# Patient Record
Sex: Female | Born: 1960
Health system: Southern US, Community
[De-identification: ages and names within clinical notes are randomized; demographics above are authoritative.]

## PROBLEM LIST (undated history)

## (undated) DIAGNOSIS — E119 Type 2 diabetes mellitus without complications: Secondary | ICD-10-CM

## (undated) DIAGNOSIS — R87619 Unspecified abnormal cytological findings in specimens from cervix uteri: Secondary | ICD-10-CM

## (undated) DIAGNOSIS — E785 Hyperlipidemia, unspecified: Secondary | ICD-10-CM

## (undated) DIAGNOSIS — A64 Unspecified sexually transmitted disease: Secondary | ICD-10-CM

## (undated) HISTORY — DX: Unspecified sexually transmitted disease: A64

## (undated) HISTORY — DX: Hyperlipidemia, unspecified: E78.5

## (undated) HISTORY — PX: CRYOTHERAPY: SHX1416

## (undated) HISTORY — PX: LAPAROSCOPY FOR ECTOPIC PREGNANCY: SUR765

## (undated) HISTORY — DX: Unspecified abnormal cytological findings in specimens from cervix uteri: R87.619

## (undated) HISTORY — DX: Type 2 diabetes mellitus without complications: E11.9

---

## 1997-04-11 HISTORY — PX: TUBAL LIGATION: SHX77

## 1997-04-11 HISTORY — PX: INGUINAL HERNIA REPAIR: SUR1180

## 1998-09-16 ENCOUNTER — Other Ambulatory Visit: Admission: RE | Admit: 1998-09-16 | Discharge: 1998-09-16 | Payer: Self-pay | Admitting: Gynecology

## 1999-09-16 ENCOUNTER — Other Ambulatory Visit: Admission: RE | Admit: 1999-09-16 | Discharge: 1999-09-16 | Payer: Self-pay | Admitting: Gynecology

## 1999-09-16 ENCOUNTER — Encounter: Payer: Self-pay | Admitting: Gynecology

## 1999-09-16 ENCOUNTER — Encounter (INDEPENDENT_AMBULATORY_CARE_PROVIDER_SITE_OTHER): Payer: Self-pay | Admitting: Specialist

## 1999-09-16 ENCOUNTER — Encounter: Admission: RE | Admit: 1999-09-16 | Discharge: 1999-09-16 | Payer: Self-pay | Admitting: Gynecology

## 1999-09-16 HISTORY — PX: BREAST BIOPSY: SHX20

## 2000-04-05 ENCOUNTER — Encounter: Admission: RE | Admit: 2000-04-05 | Discharge: 2000-04-05 | Payer: Self-pay | Admitting: Gynecology

## 2000-04-05 ENCOUNTER — Encounter: Payer: Self-pay | Admitting: Gynecology

## 2000-10-02 ENCOUNTER — Other Ambulatory Visit: Admission: RE | Admit: 2000-10-02 | Discharge: 2000-10-02 | Payer: Self-pay | Admitting: Gynecology

## 2000-10-31 ENCOUNTER — Encounter: Admission: RE | Admit: 2000-10-31 | Discharge: 2001-01-29 | Payer: Self-pay | Admitting: Internal Medicine

## 2001-04-18 ENCOUNTER — Encounter: Payer: Self-pay | Admitting: Gynecology

## 2001-04-18 ENCOUNTER — Ambulatory Visit (HOSPITAL_COMMUNITY): Admission: RE | Admit: 2001-04-18 | Discharge: 2001-04-18 | Payer: Self-pay | Admitting: Gynecology

## 2001-10-15 ENCOUNTER — Other Ambulatory Visit: Admission: RE | Admit: 2001-10-15 | Discharge: 2001-10-15 | Payer: Self-pay | Admitting: Obstetrics and Gynecology

## 2002-04-25 ENCOUNTER — Ambulatory Visit (HOSPITAL_COMMUNITY): Admission: RE | Admit: 2002-04-25 | Discharge: 2002-04-25 | Payer: Self-pay | Admitting: Obstetrics and Gynecology

## 2002-04-25 ENCOUNTER — Encounter: Payer: Self-pay | Admitting: Obstetrics and Gynecology

## 2003-02-24 ENCOUNTER — Other Ambulatory Visit: Admission: RE | Admit: 2003-02-24 | Discharge: 2003-02-24 | Payer: Self-pay | Admitting: Obstetrics and Gynecology

## 2003-04-30 ENCOUNTER — Ambulatory Visit (HOSPITAL_COMMUNITY): Admission: RE | Admit: 2003-04-30 | Discharge: 2003-04-30 | Payer: Self-pay | Admitting: Obstetrics and Gynecology

## 2003-05-01 ENCOUNTER — Encounter: Admission: RE | Admit: 2003-05-01 | Discharge: 2003-05-01 | Payer: Self-pay | Admitting: Obstetrics and Gynecology

## 2003-06-09 ENCOUNTER — Encounter: Admission: RE | Admit: 2003-06-09 | Discharge: 2003-06-09 | Payer: Self-pay | Admitting: Obstetrics and Gynecology

## 2004-04-07 ENCOUNTER — Other Ambulatory Visit: Admission: RE | Admit: 2004-04-07 | Discharge: 2004-04-07 | Payer: Self-pay | Admitting: Obstetrics and Gynecology

## 2004-05-07 ENCOUNTER — Ambulatory Visit (HOSPITAL_COMMUNITY): Admission: RE | Admit: 2004-05-07 | Discharge: 2004-05-07 | Payer: Self-pay | Admitting: Obstetrics and Gynecology

## 2004-08-05 ENCOUNTER — Encounter: Admission: RE | Admit: 2004-08-05 | Discharge: 2004-08-05 | Payer: Self-pay | Admitting: Orthopedic Surgery

## 2005-05-23 ENCOUNTER — Ambulatory Visit (HOSPITAL_COMMUNITY): Admission: RE | Admit: 2005-05-23 | Discharge: 2005-05-23 | Payer: Self-pay | Admitting: Obstetrics and Gynecology

## 2005-06-15 ENCOUNTER — Other Ambulatory Visit: Admission: RE | Admit: 2005-06-15 | Discharge: 2005-06-15 | Payer: Self-pay | Admitting: Obstetrics and Gynecology

## 2006-05-24 ENCOUNTER — Ambulatory Visit (HOSPITAL_COMMUNITY): Admission: RE | Admit: 2006-05-24 | Discharge: 2006-05-24 | Payer: Self-pay | Admitting: Obstetrics and Gynecology

## 2006-09-18 ENCOUNTER — Other Ambulatory Visit: Admission: RE | Admit: 2006-09-18 | Discharge: 2006-09-18 | Payer: Self-pay | Admitting: Obstetrics and Gynecology

## 2007-05-28 ENCOUNTER — Ambulatory Visit (HOSPITAL_COMMUNITY): Admission: RE | Admit: 2007-05-28 | Discharge: 2007-05-28 | Payer: Self-pay | Admitting: Internal Medicine

## 2007-10-01 ENCOUNTER — Other Ambulatory Visit: Admission: RE | Admit: 2007-10-01 | Discharge: 2007-10-01 | Payer: Self-pay | Admitting: Obstetrics and Gynecology

## 2008-06-03 ENCOUNTER — Ambulatory Visit (HOSPITAL_COMMUNITY): Admission: RE | Admit: 2008-06-03 | Discharge: 2008-06-03 | Payer: Self-pay | Admitting: Obstetrics and Gynecology

## 2009-03-18 DIAGNOSIS — E119 Type 2 diabetes mellitus without complications: Secondary | ICD-10-CM | POA: Insufficient documentation

## 2009-03-18 DIAGNOSIS — E785 Hyperlipidemia, unspecified: Secondary | ICD-10-CM | POA: Insufficient documentation

## 2009-03-18 DIAGNOSIS — B009 Herpesviral infection, unspecified: Secondary | ICD-10-CM | POA: Insufficient documentation

## 2009-06-17 ENCOUNTER — Ambulatory Visit (HOSPITAL_COMMUNITY): Admission: RE | Admit: 2009-06-17 | Discharge: 2009-06-17 | Payer: Self-pay | Admitting: Obstetrics and Gynecology

## 2010-05-27 ENCOUNTER — Other Ambulatory Visit (HOSPITAL_COMMUNITY): Payer: Self-pay | Admitting: Internal Medicine

## 2010-05-27 DIAGNOSIS — Z1231 Encounter for screening mammogram for malignant neoplasm of breast: Secondary | ICD-10-CM

## 2010-06-21 ENCOUNTER — Ambulatory Visit (HOSPITAL_COMMUNITY)
Admission: RE | Admit: 2010-06-21 | Discharge: 2010-06-21 | Disposition: A | Discharge status: Home / Self Care | Payer: BC Managed Care – PPO | Source: Ambulatory Visit | Attending: Internal Medicine | Admitting: Internal Medicine

## 2010-06-21 DIAGNOSIS — Z1231 Encounter for screening mammogram for malignant neoplasm of breast: Secondary | ICD-10-CM | POA: Insufficient documentation

## 2011-06-06 ENCOUNTER — Other Ambulatory Visit: Payer: Self-pay | Admitting: Certified Nurse Midwife

## 2011-06-06 DIAGNOSIS — Z1231 Encounter for screening mammogram for malignant neoplasm of breast: Secondary | ICD-10-CM

## 2011-06-28 ENCOUNTER — Ambulatory Visit (HOSPITAL_COMMUNITY)
Admission: RE | Admit: 2011-06-28 | Discharge: 2011-06-28 | Disposition: A | Discharge status: Home / Self Care | Payer: BC Managed Care – PPO | Source: Ambulatory Visit | Attending: Certified Nurse Midwife | Admitting: Certified Nurse Midwife

## 2011-06-28 DIAGNOSIS — Z1231 Encounter for screening mammogram for malignant neoplasm of breast: Secondary | ICD-10-CM | POA: Insufficient documentation

## 2012-04-11 HISTORY — PX: COLONOSCOPY: SHX174

## 2012-04-16 ENCOUNTER — Encounter: Payer: Self-pay | Admitting: Internal Medicine

## 2012-05-18 ENCOUNTER — Ambulatory Visit (AMBULATORY_SURGERY_CENTER): Payer: BC Managed Care – PPO | Admitting: *Deleted

## 2012-05-18 ENCOUNTER — Encounter: Payer: Self-pay | Admitting: Internal Medicine

## 2012-05-18 VITALS — Ht 66.0 in | Wt 139.0 lb

## 2012-05-18 DIAGNOSIS — Z1211 Encounter for screening for malignant neoplasm of colon: Secondary | ICD-10-CM

## 2012-05-18 MED ORDER — MOVIPREP 100 G PO SOLR: ORAL | Status: DC

## 2012-05-25 ENCOUNTER — Other Ambulatory Visit (HOSPITAL_COMMUNITY): Payer: Self-pay | Admitting: Certified Nurse Midwife

## 2012-05-25 DIAGNOSIS — Z1231 Encounter for screening mammogram for malignant neoplasm of breast: Secondary | ICD-10-CM

## 2012-05-29 ENCOUNTER — Encounter (HOSPITAL_COMMUNITY): Payer: Self-pay | Admitting: Emergency Medicine

## 2012-05-29 ENCOUNTER — Emergency Department (HOSPITAL_COMMUNITY)
Admission: EM | Admit: 2012-05-29 | Discharge: 2012-05-29 | Disposition: A | Discharge status: Home / Self Care | Payer: BC Managed Care – PPO | Attending: Emergency Medicine | Admitting: Emergency Medicine

## 2012-05-29 DIAGNOSIS — R110 Nausea: Secondary | ICD-10-CM | POA: Insufficient documentation

## 2012-05-29 DIAGNOSIS — Z794 Long term (current) use of insulin: Secondary | ICD-10-CM | POA: Insufficient documentation

## 2012-05-29 DIAGNOSIS — Z79899 Other long term (current) drug therapy: Secondary | ICD-10-CM | POA: Insufficient documentation

## 2012-05-29 DIAGNOSIS — R4182 Altered mental status, unspecified: Secondary | ICD-10-CM | POA: Insufficient documentation

## 2012-05-29 DIAGNOSIS — E1069 Type 1 diabetes mellitus with other specified complication: Secondary | ICD-10-CM | POA: Insufficient documentation

## 2012-05-29 DIAGNOSIS — E162 Hypoglycemia, unspecified: Secondary | ICD-10-CM

## 2012-05-29 DIAGNOSIS — F29 Unspecified psychosis not due to a substance or known physiological condition: Secondary | ICD-10-CM | POA: Insufficient documentation

## 2012-05-29 DIAGNOSIS — R61 Generalized hyperhidrosis: Secondary | ICD-10-CM | POA: Insufficient documentation

## 2012-05-29 DIAGNOSIS — R5381 Other malaise: Secondary | ICD-10-CM | POA: Insufficient documentation

## 2012-05-29 DIAGNOSIS — E785 Hyperlipidemia, unspecified: Secondary | ICD-10-CM | POA: Insufficient documentation

## 2012-05-29 LAB — GLUCOSE, CAPILLARY
Glucose-Capillary: 23 mg/dL — CL (ref 70–99)
Glucose-Capillary: 125 mg/dL — ABNORMAL HIGH (ref 70–99)

## 2012-05-29 MED ORDER — GLUCOSE-VITAMIN C 4-6 GM-MG PO CHEW
CHEWABLE_TABLET | ORAL | Status: AC
  Filled 2012-05-29: qty 1

## 2012-05-29 MED ORDER — DEXTROSE 50 % IV SOLN
INTRAVENOUS | Status: AC
  Administered 2012-05-29: 50 mL
  Filled 2012-05-29: qty 50

## 2012-05-29 NOTE — ED Notes (Signed)
Pt started to become confused and diaphoretic at home. Pt has been doing colonoscopy prep at home and restricted diet. Pt presented to ER disoriented x4, unable to follow commands. CBG- 23 on arrival. D50 amp given IV. Pt now oriented to person and place.

## 2012-05-29 NOTE — ED Provider Notes (Signed)
History     CSN: [id number]  Arrival date & time 05/29/12  2204   None     Chief Complaint  Patient presents with  . Hypoglycemia    (Consider location/radiation/quality/duration/timing/severity/associated sxs/prior treatment) Patient is a 52 y.o. female presenting with altered mental status.  Altered Mental Status This is a new problem. The current episode started today. The problem occurs constantly. The problem has been rapidly improving. Associated symptoms include diaphoresis, fatigue and nausea. Pertinent negatives include no abdominal pain, arthralgias, chest pain, chills, congestion, coughing, fever, headaches, neck pain, numbness, sore throat, vomiting or weakness. Associated symptoms comments: Diaphoresis. Nothing aggravates the symptoms. She has tried nothing for the symptoms. The treatment provided no relief.    Past Medical History  Diagnosis Date  . Diabetes mellitus without complication     type 1  . Hyperlipidemia     Past Surgical History  Procedure Laterality Date  . Tubal ligation  1999  . Inguinal hernia repair  1999    left  . Laparoscopy for ectopic pregnancy  1993 & 1992    No family history on file.  History  Substance Use Topics  . Smoking status: Never Smoker   . Smokeless tobacco: Never Used  . Alcohol Use: 1.8 oz/week    3 Glasses of wine per week    OB History   Grav Para Term Preterm Abortions TAB SAB Ect Mult Living                  Review of Systems  Constitutional: Positive for diaphoresis and fatigue. Negative for fever, chills, activity change and appetite change.  HENT: Negative for congestion, sore throat, facial swelling, rhinorrhea, neck pain and neck stiffness.   Eyes: Negative for photophobia and discharge.  Respiratory: Negative for cough, chest tightness and shortness of breath.   Cardiovascular: Negative for chest pain, palpitations and leg swelling.  Gastrointestinal: Positive for nausea. Negative for vomiting,  abdominal pain and diarrhea.  Endocrine: Negative for polydipsia and polyuria.  Genitourinary: Negative for dysuria, frequency, difficulty urinating and pelvic pain.  Musculoskeletal: Negative for back pain and arthralgias.  Skin: Negative for color change and wound.  Allergic/Immunologic: Negative for immunocompromised state.  Neurological: Negative for facial asymmetry, weakness, numbness and headaches.  Hematological: Does not bruise/bleed easily.  Psychiatric/Behavioral: Positive for confusion and altered mental status. Negative for agitation.    Allergies  Penicillins  Home Medications   Current Outpatient Rx  Name  Route  Sig  Dispense  Refill  . insulin glargine (LANTUS) 100 UNIT/ML injection   Subcutaneous   Inject 16-18 Units into the skin daily with breakfast. Based on CBG         . insulin lispro (HUMALOG) 100 UNIT/ML injection   Subcutaneous   Inject 4-12 Units into the skin 2 (two) times daily. Sliding scale based on CBG         . metFORMIN (GLUMETZA) 500 MG (MOD) 24 hr tablet   Oral   Take 1,000 mg by mouth at bedtime.         . simvastatin (ZOCOR) 40 MG tablet   Oral   Take 40 mg by mouth every evening.         Marland Kitchen MOVIPREP 100 G SOLR      MOVI PREP take as directed no substitution   1 kit   0     Dispense as written.     BP 119/77  Pulse 63  Temp(Src) 0 F (-17.8 C)  Resp  18  SpO2 100%  Physical Exam  Constitutional: She is oriented to person, place, and time. She appears well-developed and well-nourished. No distress.  HENT:  Head: Normocephalic and atraumatic.  Mouth/Throat: No oropharyngeal exudate.  Eyes: Pupils are equal, round, and reactive to light.  Neck: Normal range of motion. Neck supple.  Cardiovascular: Normal rate, regular rhythm and normal heart sounds.  Exam reveals no gallop and no friction rub.   No murmur heard. Pulmonary/Chest: Effort normal and breath sounds normal. No respiratory distress. She has no wheezes. She  has no rales.  Abdominal: Soft. Bowel sounds are normal. She exhibits no distension and no mass. There is no tenderness. There is no rebound and no guarding.  Musculoskeletal: Normal range of motion. She exhibits no edema and no tenderness.  Neurological: She is alert and oriented to person, place, and time. She has normal strength. She displays no tremor. No cranial nerve deficit or sensory deficit. She exhibits normal muscle tone. Coordination normal. GCS eye subscore is 4. GCS verbal subscore is 5. GCS motor subscore is 6.  Skin: Skin is warm. She is diaphoretic.  Psychiatric: She has a normal mood and affect.    ED Course  Procedures (including critical care time)  Labs Reviewed  GLUCOSE, CAPILLARY - Abnormal; Notable for the following:    Glucose-Capillary 23 (*)    All other components within normal limits  GLUCOSE, CAPILLARY - Abnormal; Notable for the following:    Glucose-Capillary 125 (*)    All other components within normal limits   No results found.   1. Hypoglycemia       MDM  Pt is a 52 y.o. female with pertinent PMHX of DM, HLD who presents with confusion, diaphoresis, and FSBG 23.  1 amp D50 given upon arrival with improvement of symptoms.  At time of my exam, pt A&Ox3, no focal neuro findings. Will give [place] sandwich and reexamine. Doubt TIA/CVA, cardiac event.  11:27 PM Pt feeling improved. Will d/c home w/ instructions for more frequest FSBG check at home, cautious use of large insulin boluses and PCP f/u.  Return precautions given for new or worsening symptoms.  1. Hypoglycemia      Labs and imaging considered in decision making, reviewed by myself.  Imaging interpreted by radiology. Pt care discussed with my attending, Dr. Jeraldine Loots.         Toy Cookey, MD 05/30/12 [phone]

## 2012-05-29 NOTE — ED Notes (Signed)
CBG 23 

## 2012-05-31 NOTE — ED Provider Notes (Signed)
  I performed a history and physical examination of Sandra Hooper and discussed her management with Dr. Micheline Maze.  I agree with the history, physical, assessment, and plan of care, with the following exceptions: None  On my exam the patient was recovering well, mentating appropriately, with unremarkable vital signs.    Elyse Jarvis, MD 05/31/12 [phone]

## 2012-06-01 ENCOUNTER — Ambulatory Visit (AMBULATORY_SURGERY_CENTER): Payer: BC Managed Care – PPO | Admitting: Internal Medicine

## 2012-06-01 ENCOUNTER — Encounter: Payer: Self-pay | Admitting: Internal Medicine

## 2012-06-01 ENCOUNTER — Other Ambulatory Visit: Payer: Self-pay | Admitting: Internal Medicine

## 2012-06-01 VITALS — BP 121/81 | HR 55 | Temp 98.4°F | Resp 17 | Ht 66.0 in | Wt 139.0 lb

## 2012-06-01 DIAGNOSIS — Z1211 Encounter for screening for malignant neoplasm of colon: Secondary | ICD-10-CM

## 2012-06-01 LAB — GLUCOSE, CAPILLARY
Glucose-Capillary: 219 mg/dL — ABNORMAL HIGH (ref 70–99)
Glucose-Capillary: 67 mg/dL — ABNORMAL LOW (ref 70–99)
Glucose-Capillary: 148 mg/dL — ABNORMAL HIGH (ref 70–99)

## 2012-06-01 MED ORDER — SODIUM CHLORIDE 0.9 % IV SOLN: 500.0000 mL | INTRAVENOUS | Status: DC

## 2012-06-01 NOTE — Progress Notes (Signed)
In recovery room report to pacu rn, vss, bbs=clear

## 2012-06-01 NOTE — Op Note (Signed)
[facility] [address] [place], [address]   COLONOSCOPY PROCEDURE REPORT  PATIENT: Sandra Hooper, Sandra Hooper  MR#: [id number] BIRTHDATE: 1960-07-25 , 52  yrs. old GENDER: Female ENDOSCOPIST: Hart Carwin, MD REFERRED BY:  Francis Gaines, M.D. PROCEDURE DATE:  06/01/2012 PROCEDURE:   Colonoscopy, screening ASA CLASS:   Class I INDICATIONS:Average risk patient for colon cancer. MEDICATIONS: MAC sedation, administered by CRNA and Propofol (Diprivan) 280 mg IV  DESCRIPTION OF PROCEDURE:   After the risks and benefits and of the procedure were explained, informed consent was obtained.  A digital rectal exam revealed no abnormalities of the rectum.    The LB [id number]-[id number]  endoscope was introduced through the anus and advanced to the cecum, which was identified by both the appendix and ileocecal valve .  The quality of the prep was good, using MoviPrep .  The instrument was then slowly withdrawn as the colon was fully examined.     COLON FINDINGS: Mild diverticulosis was noted in the sigmoid colon. Internal hemorrhoids were found.     Retroflexed views revealed no abnormalities.     The scope was then withdrawn from the patient and the procedure completed.  COMPLICATIONS: There were no complications. ENDOSCOPIC IMPRESSION: 1.   Mild diverticulosis was noted in the sigmoid colon 2.   Internal hemorrhoids  RECOMMENDATIONS: High fiber diet   REPEAT EXAM: In 10 year(s)  for Colonoscopy.  cc:  _______________________________ eSignedHart Carwin, MD 06/01/2012 9:08 AM

## 2012-06-01 NOTE — Patient Instructions (Addendum)
YOU HAD AN ENDOSCOPIC PROCEDURE TODAY AT THE [facility]: Refer to the procedure report that was given to you for any specific questions about what was found during the examination.  If the procedure report does not answer your questions, please call your gastroenterologist to clarify.  If you requested that your care partner not be given the details of your procedure findings, then the procedure report has been included in a sealed envelope for you to review at your convenience later.  YOU SHOULD EXPECT: Some feelings of bloating in the abdomen. Passage of more gas than usual.  Walking can help get rid of the air that was put into your GI tract during the procedure and reduce the bloating. If you had a lower endoscopy (such as a colonoscopy or flexible sigmoidoscopy) you may notice spotting of blood in your stool or on the toilet paper. If you underwent a bowel prep for your procedure, then you may not have a normal bowel movement for a few days.  DIET: Your first meal following the procedure should be a light meal and then it is ok to progress to your normal diet.  A half-sandwich or bowl of soup is an example of a good first meal.  Heavy or fried foods are harder to digest and may make you feel nauseous or bloated.  Likewise meals heavy in dairy and vegetables can cause extra gas to form and this can also increase the bloating.  Drink plenty of fluids but you should avoid alcoholic beverages for 24 hours.  ACTIVITY: Your care partner should take you home directly after the procedure.  You should plan to take it easy, moving slowly for the rest of the day.  You can resume normal activity the day after the procedure however you should NOT DRIVE or use heavy machinery for 24 hours (because of the sedation medicines used during the test).    SYMPTOMS TO REPORT IMMEDIATELY: A gastroenterologist can be reached at any hour.  During normal business hours, 8:30 AM to 5:00 PM Monday through Friday,  call [phone].  After hours and on weekends, please call the GI answering service at [phone] who will take a message and have the physician on call contact you.   Following lower endoscopy (colonoscopy or flexible sigmoidoscopy):  Excessive amounts of blood in the stool  Significant tenderness or worsening of abdominal pains  Swelling of the abdomen that is new, acute  Fever of 100F or higher  FOLLOW UP: If any biopsies were taken you will be contacted by phone or by letter within the next 1-3 weeks.  Call your gastroenterologist if you have not heard about the biopsies in 3 weeks.  Our staff will call the home number listed on your records the next business day following your procedure to check on you and address any questions or concerns that you may have at that time regarding the information given to you following your procedure. This is a courtesy call and so if there is no answer at the home number and we have not heard from you through the emergency physician on call, we will assume that you have returned to your regular daily activities without incident.  SIGNATURES/CONFIDENTIALITY: You and/or your care partner have signed paperwork which will be entered into your electronic medical record.  These signatures attest to the fact that that the information above on your After Visit Summary has been reviewed and is understood.  Full responsibility of the confidentiality of this   discharge information lies with you and/or your care-partner.   Thank-you for choosing us for your medical needs. 

## 2012-06-01 NOTE — Progress Notes (Signed)
Patient did not have preoperative order for IV antibiotic SSI prophylaxis. (G8918)  Patient did not experience any of the following events: a burn prior to discharge; a fall within the facility; wrong site/side/patient/procedure/implant event; or a hospital transfer or hospital admission upon discharge from the facility. (G8907)  

## 2012-06-04 ENCOUNTER — Telehealth: Payer: Self-pay

## 2012-06-04 NOTE — Telephone Encounter (Signed)
Left message

## 2012-06-25 DIAGNOSIS — E162 Hypoglycemia, unspecified: Secondary | ICD-10-CM | POA: Insufficient documentation

## 2012-06-28 ENCOUNTER — Ambulatory Visit (HOSPITAL_COMMUNITY): Payer: BC Managed Care – PPO

## 2012-07-02 ENCOUNTER — Ambulatory Visit (HOSPITAL_COMMUNITY)
Admission: RE | Admit: 2012-07-02 | Discharge: 2012-07-02 | Disposition: A | Discharge status: Home / Self Care | Payer: BC Managed Care – PPO | Source: Ambulatory Visit | Attending: Certified Nurse Midwife | Admitting: Certified Nurse Midwife

## 2012-07-02 DIAGNOSIS — Z1231 Encounter for screening mammogram for malignant neoplasm of breast: Secondary | ICD-10-CM | POA: Insufficient documentation

## 2012-12-11 ENCOUNTER — Encounter: Payer: Self-pay | Admitting: Certified Nurse Midwife

## 2012-12-11 ENCOUNTER — Ambulatory Visit (INDEPENDENT_AMBULATORY_CARE_PROVIDER_SITE_OTHER): Payer: BC Managed Care – PPO | Admitting: Certified Nurse Midwife

## 2012-12-11 VITALS — BP 104/64 | HR 64 | Resp 16 | Ht 65.75 in | Wt 137.0 lb

## 2012-12-11 DIAGNOSIS — E109 Type 1 diabetes mellitus without complications: Secondary | ICD-10-CM

## 2012-12-11 DIAGNOSIS — IMO0001 Reserved for inherently not codable concepts without codable children: Secondary | ICD-10-CM

## 2012-12-11 DIAGNOSIS — Z8639 Personal history of other endocrine, nutritional and metabolic disease: Secondary | ICD-10-CM | POA: Insufficient documentation

## 2012-12-11 DIAGNOSIS — Z01419 Encounter for gynecological examination (general) (routine) without abnormal findings: Secondary | ICD-10-CM

## 2012-12-11 NOTE — Patient Instructions (Addendum)

## 2012-12-11 NOTE — Progress Notes (Signed)
52 y.o. [id number] Married Caucasian Fe here for annual exam. Menopausal no HRT. No vaginal bleeding or vaginal dryness.  Has had unstable blood sugar on insulin, working with Dr. Felipa Eth regarding. Was hospitalized for hypoglycemia episode. Has all labs and aex with PCP. No health issues today.  Patient's last menstrual period was 10/10/2007.          Sexually active: yes  The current method of family planning is tubal ligation.    Exercising: yes  tennis, walking, spin, & core training Smoker:  no  Health Maintenance: Pap:  11-2911 neg  HPV HR neg MMG:  3/14 Colonoscopy:  2/14 BMD:   none TDaP:  09/18/06 Labs: none Self breast exam: done monthly   reports that she has never smoked. She has never used smokeless tobacco. She reports that she drinks about 1.8 ounces of alcohol per week. She reports that she does not use illicit drugs.  Past Medical History  Diagnosis Date  . Diabetes mellitus without complication     type 1  . Hyperlipidemia   . STD (sexually transmitted disease)     HSV1    Past Surgical History  Procedure Laterality Date  . Inguinal hernia repair  1999    left  . Laparoscopy for ectopic pregnancy  1993 & 1992  . Tubal ligation  1999    Current Outpatient Prescriptions  Medication Sig Dispense Refill  . insulin glargine (LANTUS) 100 UNIT/ML injection Inject 12-14 Units into the skin daily with breakfast. Based on CBG      . insulin lispro (HUMALOG) 100 UNIT/ML injection Inject into the skin 2 (two) times daily. Sliding scale based on CBG 4 units in the am & 7-11 in the evening depending on level      . metFORMIN (GLUCOPHAGE-XR) 500 MG 24 hr tablet Take 1,000 mg by mouth.      Marland Kitchen SIMVASTATIN PO Take by mouth daily.       No current facility-administered medications for this visit.    Family History  Problem Relation Age of Onset  . Hypertension Father   . Cancer Father     prostate  . Parkinson's disease Father   . Breast cancer Maternal Aunt   . Breast  cancer Paternal Aunt   . Diabetes Maternal Grandfather   . Osteoarthritis Mother     ROS:  Pertinent items are noted in HPI.  Otherwise, a comprehensive ROS was negative.  Exam:   BP 104/64  Pulse 64  Resp 16  Ht 5' 5.75" (1.67 m)  Wt 137 lb (62.143 kg)  BMI 22.28 kg/m2  LMP 10/10/2007 Height: 5' 5.75" (167 cm)  Ht Readings from Last 3 Encounters:  12/11/12 5' 5.75" (1.67 m)  06/01/12 5\' 6"  (1.676 m)  05/18/12 5\' 6"  (1.676 m)    General appearance: alert, cooperative and appears stated age Head: Normocephalic, without obvious abnormality, atraumatic Neck: no adenopathy, supple, symmetrical, trachea midline and thyroid normal to inspection and palpation and non-palpable Lungs: clear to auscultation bilaterally Breasts: normal appearance, no masses or tenderness, No nipple retraction or dimpling, No nipple discharge or bleeding, No axillary or supraclavicular adenopathy Heart: regular rate and rhythm Abdomen: soft, non-tender; no masses,  no organomegaly Extremities: extremities normal, atraumatic, no cyanosis or edema Skin: Skin color, texture, turgor normal. No rashes or lesions Lymph nodes: Cervical, supraclavicular, and axillary nodes normal. No abnormal inguinal nodes palpated Neurologic: Grossly normal   Pelvic: External genitalia:  no lesions  Urethra:  normal appearing urethra with no masses, tenderness or lesions              Bartholin's and Skene's: normal                 Vagina: normal appearing vagina with normal color and discharge, no lesions              Cervix: normal, non tender              Pap taken: no Bimanual Exam:  Uterus:  normal size, contour, position, consistency, mobility, non-tender and anteverted              Adnexa: normal adnexa and no mass, fullness, tenderness               Rectovaginal: Confirms               Anus:  normal sphincter tone, no lesions  A:  Well Woman with normal exam  Menopausal no HRT  IDDM adult onset,  unstable at present  P:   Reviewed health and wellness pertinent to exam  Aware of need to evaluate if vaginal bleeding  Continue close MD follow up as indicated  Pap smear as per guidelines   Mammogram yearly pap smear not taken today  counseled on breast self exam, mammography screening, menopause, adequate intake of calcium and vitamin D, diet and exercise, Kegel's exercises  return annually or prn  An After Visit Summary was printed and given to the patient.

## 2012-12-12 NOTE — Progress Notes (Signed)
Note reviewed, agree with plan.  Song Myre, MD  

## 2013-06-04 ENCOUNTER — Other Ambulatory Visit: Payer: Self-pay | Admitting: Nurse Practitioner

## 2013-06-04 DIAGNOSIS — Z1231 Encounter for screening mammogram for malignant neoplasm of breast: Secondary | ICD-10-CM

## 2013-07-03 ENCOUNTER — Ambulatory Visit (HOSPITAL_COMMUNITY)
Admission: RE | Admit: 2013-07-03 | Discharge: 2013-07-03 | Disposition: A | Discharge status: Home / Self Care | Payer: BC Managed Care – PPO | Source: Ambulatory Visit | Attending: Nurse Practitioner | Admitting: Nurse Practitioner

## 2013-07-03 DIAGNOSIS — Z1231 Encounter for screening mammogram for malignant neoplasm of breast: Secondary | ICD-10-CM

## 2013-12-13 ENCOUNTER — Ambulatory Visit: Payer: BC Managed Care – PPO | Admitting: Certified Nurse Midwife

## 2013-12-17 ENCOUNTER — Ambulatory Visit (INDEPENDENT_AMBULATORY_CARE_PROVIDER_SITE_OTHER): Payer: BC Managed Care – PPO | Admitting: Certified Nurse Midwife

## 2013-12-17 ENCOUNTER — Encounter: Payer: Self-pay | Admitting: Certified Nurse Midwife

## 2013-12-17 VITALS — BP 104/64 | HR 70 | Resp 16 | Ht 65.75 in | Wt 138.0 lb

## 2013-12-17 DIAGNOSIS — Z01419 Encounter for gynecological examination (general) (routine) without abnormal findings: Secondary | ICD-10-CM

## 2013-12-17 DIAGNOSIS — Z124 Encounter for screening for malignant neoplasm of cervix: Secondary | ICD-10-CM

## 2013-12-17 NOTE — Patient Instructions (Signed)

## 2013-12-17 NOTE — Progress Notes (Signed)
53 y.o. [id number] Married Caucasian Fe here for annual exam. Menopausal no HRT. Denies vaginal bleeding or vaginal dryness. Diabetes stable with Insulin in the past year, managed with Dr.Avva, also aex, labs. All normal per patient.  No other health issues today.  Patient's last menstrual period was 10/10/2007.          Sexually active: Yes.    The current method of family planning is tubal ligation.    Exercising: Yes.    walking,tennis,barre Smoker:  no  Health Maintenance: Pap:  12-08-11 neg HPV HR neg MMG:  07-03-13 density category c, birads category 1:neg 3D Colonoscopy:  2/14 normal 10 years BMD:   none TDaP:  09-18-06 Labs: pcp Self breast exam: done monthly   reports that she has never smoked. She has never used smokeless tobacco. She reports that she drinks about 2.4 ounces of alcohol per week. She reports that she does not use illicit drugs.  Past Medical History  Diagnosis Date  . Diabetes mellitus without complication     type 1  . Hyperlipidemia   . STD (sexually transmitted disease)     HSV1    Past Surgical History  Procedure Laterality Date  . Inguinal hernia repair  1999    left  . Laparoscopy for ectopic pregnancy  1993 & 1992  . Tubal ligation  1999    Current Outpatient Prescriptions  Medication Sig Dispense Refill  . insulin aspart (NOVOLOG) 100 UNIT/ML injection Inject into the skin. Inject into the skin 2 times daily. Sliding scale based on CBG 4 units in the am & 7-11 in the evening depending on level      . insulin glargine (LANTUS) 100 UNIT/ML injection Inject 12-14 Units into the skin daily with breakfast. Based on CBG      . metFORMIN (GLUCOPHAGE-XR) 500 MG 24 hr tablet Take 1,000 mg by mouth.      Marland Kitchen SIMVASTATIN PO Take by mouth daily.       No current facility-administered medications for this visit.    Family History  Problem Relation Age of Onset  . Hypertension Father   . Cancer Father     prostate  . Parkinson's disease Father   . Breast  cancer Maternal Aunt   . Breast cancer Paternal Aunt   . Diabetes Maternal Grandfather   . Osteoarthritis Mother     ROS:  Pertinent items are noted in HPI.  Otherwise, a comprehensive ROS was negative.  Exam:   BP 104/64  Pulse 70  Resp 16  Ht 5' 5.75" (1.67 m)  Wt 138 lb (62.596 kg)  BMI 22.44 kg/m2  LMP 10/10/2007 Height: 5' 5.75" (167 cm)  Ht Readings from Last 3 Encounters:  12/17/13 5' 5.75" (1.67 m)  12/11/12 5' 5.75" (1.67 m)  06/01/12  (1.676 m)    General appearance: alert, cooperative and appears stated age Head: Normocephalic, without obvious abnormality, atraumatic Neck: no adenopathy, supple, symmetrical, trachea midline and thyroid normal to inspection and palpation Lungs: clear to auscultation bilaterally Breasts: normal appearance, no masses or tenderness, No nipple retraction or dimpling, No nipple discharge or bleeding, No axillary or supraclavicular adenopathy Heart: regular rate and rhythm Abdomen: soft, non-tender; no masses,  no organomegaly Extremities: extremities normal, atraumatic, no cyanosis or edema Skin: Skin color, texture, turgor normal. No rashes or lesions Lymph nodes: Cervical, supraclavicular, and axillary nodes normal. No abnormal inguinal nodes palpated Neurologic: Grossly normal   Pelvic: External genitalia:  no lesions  Urethra:  normal appearing urethra with no masses, tenderness or lesions              Bartholin's and Skene's: normal                 Vagina: normal appearing vagina with normal color and discharge, no lesions              Cervix: normal,non tender, no lesions              Pap taken: Yes.   Bimanual Exam:  Uterus:  normal size, contour, position, consistency, mobility, non-tender and anteverted              Adnexa: normal adnexa and no mass, fullness, tenderness               Rectovaginal: Confirms               Anus:  normal sphincter tone, no lesions  A:  Well Woman with normal exam  Menopausal  no HRT  IDDM stable with PCP management    P:   Reviewed health and wellness pertinent to exam  Aware of need to evaluate if vaginal bleeding.  Continue follow up as indicated.  Pap smear taken today with HPV reflex  counseled on breast self exam, mammography screening, adequate intake of calcium and vitamin D, diet and exercise  return annually or prn  An After Visit Summary was printed and given to the patient.

## 2013-12-23 LAB — IPS PAP TEST WITH REFLEX TO HPV

## 2013-12-29 NOTE — Progress Notes (Signed)
Reviewed personally.  M. Suzanne Deseree Zemaitis, MD.  

## 2014-02-10 ENCOUNTER — Encounter: Payer: Self-pay | Admitting: Certified Nurse Midwife

## 2014-06-03 ENCOUNTER — Other Ambulatory Visit (HOSPITAL_COMMUNITY): Payer: Self-pay | Admitting: Internal Medicine

## 2014-06-03 DIAGNOSIS — Z1231 Encounter for screening mammogram for malignant neoplasm of breast: Secondary | ICD-10-CM

## 2014-07-14 ENCOUNTER — Ambulatory Visit (HOSPITAL_COMMUNITY)
Admission: RE | Admit: 2014-07-14 | Discharge: 2014-07-14 | Disposition: A | Discharge status: Home / Self Care | Payer: BLUE CROSS/BLUE SHIELD | Source: Ambulatory Visit | Attending: Internal Medicine | Admitting: Internal Medicine

## 2014-07-14 DIAGNOSIS — Z1231 Encounter for screening mammogram for malignant neoplasm of breast: Secondary | ICD-10-CM | POA: Diagnosis present

## 2014-07-15 ENCOUNTER — Other Ambulatory Visit: Payer: Self-pay | Admitting: Internal Medicine

## 2014-07-15 DIAGNOSIS — R928 Other abnormal and inconclusive findings on diagnostic imaging of breast: Secondary | ICD-10-CM

## 2014-07-16 ENCOUNTER — Ambulatory Visit
Admission: RE | Admit: 2014-07-16 | Discharge: 2014-07-16 | Disposition: A | Discharge status: Home / Self Care | Payer: BLUE CROSS/BLUE SHIELD | Source: Ambulatory Visit | Attending: Internal Medicine | Admitting: Internal Medicine

## 2014-07-16 DIAGNOSIS — R928 Other abnormal and inconclusive findings on diagnostic imaging of breast: Secondary | ICD-10-CM

## 2014-07-22 ENCOUNTER — Telehealth: Payer: Self-pay | Admitting: Certified Nurse Midwife

## 2014-07-22 NOTE — Telephone Encounter (Signed)
Left message on voicemail to reschedule aex in September.

## 2014-12-09 DIAGNOSIS — Z780 Asymptomatic menopausal state: Secondary | ICD-10-CM | POA: Insufficient documentation

## 2014-12-18 ENCOUNTER — Other Ambulatory Visit: Payer: Self-pay | Admitting: Internal Medicine

## 2014-12-18 DIAGNOSIS — R921 Mammographic calcification found on diagnostic imaging of breast: Secondary | ICD-10-CM

## 2014-12-22 ENCOUNTER — Ambulatory Visit: Payer: BC Managed Care – PPO | Admitting: Certified Nurse Midwife

## 2014-12-23 ENCOUNTER — Ambulatory Visit (INDEPENDENT_AMBULATORY_CARE_PROVIDER_SITE_OTHER): Payer: BLUE CROSS/BLUE SHIELD | Admitting: Certified Nurse Midwife

## 2014-12-23 ENCOUNTER — Encounter: Payer: Self-pay | Admitting: Certified Nurse Midwife

## 2014-12-23 VITALS — BP 104/64 | HR 68 | Resp 16 | Ht 65.5 in | Wt 136.0 lb

## 2014-12-23 DIAGNOSIS — N951 Menopausal and female climacteric states: Secondary | ICD-10-CM

## 2014-12-23 DIAGNOSIS — Z01419 Encounter for gynecological examination (general) (routine) without abnormal findings: Secondary | ICD-10-CM | POA: Diagnosis not present

## 2014-12-23 NOTE — Progress Notes (Signed)
54 y.o. [id number] Married  Caucasian Fe here for annual exam. Menopausal no HRT. Denies vaginal bleeding. Sad with loss of Dad 10/08/14.Grieving with mother, but emotionally OK. Sees PCP every 4 months, manages her diabetes, all stable.Also Labs and aex. Patient is having some vaginal dryness now especially with sexual activity. Patient due for follow up mammogram for calcifications in right breast in 10/16. Doing SBE and has noted no change. No other health issues.  Patient's last menstrual period was 10/10/2007.          Sexually active: Yes.    The current method of family planning is tubal ligation.    Exercising: Yes.    tennis,walking, aerobics Smoker:  no  Health Maintenance: Pap: 12-17-13 neg MMG: 07-14-14 & rt breast diag category c density, birads 3 Prob benign, f/u 10/16 Colonoscopy: 2/14 neg f/u 75yrs BMD:   9/16 TDaP:  2008 Labs: pcp Self breast exam: done weekly   reports that she has never smoked. She has never used smokeless tobacco. She reports that she drinks about 2.4 oz of alcohol per week. She reports that she does not use illicit drugs.  Past Medical History  Diagnosis Date  . Diabetes mellitus without complication     type 1  . Hyperlipidemia   . STD (sexually transmitted disease)     HSV1    Past Surgical History  Procedure Laterality Date  . Inguinal hernia repair  1999    left  . Laparoscopy for ectopic pregnancy  1993 & 1992  . Tubal ligation  1999    Current Outpatient Prescriptions  Medication Sig Dispense Refill  . insulin aspart (NOVOLOG) 100 UNIT/ML injection Inject into the skin. Inject into the skin 2 times daily. Sliding scale based on CBG 4 units in the am & 7-11 in the evening depending on level    . insulin glargine (LANTUS) 100 UNIT/ML injection Inject 12-14 Units into the skin daily with breakfast. Based on CBG    . metFORMIN (GLUCOPHAGE-XR) 500 MG 24 hr tablet Take 1,000 mg by mouth.    Marland Kitchen SIMVASTATIN PO Take by mouth daily.     No current  facility-administered medications for this visit.    Family History  Problem Relation Age of Onset  . Hypertension Father   . Cancer Father     prostate  . Parkinson's disease Father   . Breast cancer Maternal Aunt   . Breast cancer Paternal Aunt   . Diabetes Maternal Grandfather   . Osteoarthritis Mother     ROS:  Pertinent items are noted in HPI.  Otherwise, a comprehensive ROS was negative.  Exam:   BP 104/64 mmHg  Pulse 68  Resp 16  Ht 5' 5.5" (1.664 m)  Wt 136 lb (61.689 kg)  BMI 22.28 kg/m2  LMP 10/10/2007 Height: 5' 5.5" (166.4 cm) Ht Readings from Last 3 Encounters:  12/23/14 5' 5.5" (1.664 m)  12/17/13 5' 5.75" (1.67 m)  12/11/12 5' 5.75" (1.67 m)    General appearance: alert, cooperative and appears stated age Head: Normocephalic, without obvious abnormality, atraumatic Neck: no adenopathy, supple, symmetrical, trachea midline and thyroid normal to inspection and palpation Lungs: clear to auscultation bilaterally Breasts: normal appearance, no masses or tenderness, No nipple retraction or dimpling, No nipple discharge or bleeding, No axillary or supraclavicular adenopathy Heart: regular rate and rhythm Abdomen: soft, non-tender; no masses,  no organomegaly Extremities: extremities normal, atraumatic, no cyanosis or edema Skin: Skin color, texture, turgor normal. No rashes or lesions Lymph nodes:  Cervical, supraclavicular, and axillary nodes normal. No abnormal inguinal nodes palpated Neurologic: Grossly normal   Pelvic: External genitalia:  no lesions              Urethra:  normal appearing urethra with no masses, tenderness or lesions              Bartholin's and Skene's: normal                 Vagina: normal appearing vagina with normal color and discharge, no lesions              Cervix: normal,non tender              Pap taken: No. Bimanual Exam:  Uterus:  normal size, contour, position, consistency, mobility, non-tender              Adnexa: normal  adnexa and no mass, fullness, tenderness               Rectovaginal: Confirms               Anus:  normal sphincter tone, no lesions    A:  Well Woman with normal exam  Menopausal no HRT  Vaginal dryness  Type 1 Diabetes under good control with PCP management  Calcifications noted on screening mammogram in right breast, follow up due 10/16  Social stress with fathers death  P:   Reviewed health and wellness pertinent to exam  Aware of need to evaluate if vaginal bleeding  Discussed coconut or olive oil use and instructions given. Will advise if no change. Will not use Estrogen.  Continue follow up as indicated with MD  Has follow up appointment and plans to make sure she keeps it.  Discussed Hospice grief counseling if needed. Her mother is attending. Patient feels she has adequate support.  Pap smear as above  counseled on breast self exam, mammography screening, adequate intake of calcium and vitamin D, diet and exercise  return annually or prn  An After Visit Summary was printed and given to the patient.

## 2014-12-23 NOTE — Progress Notes (Signed)
Reviewed personally.  M. Suzanne Abagael Kramm, MD.  

## 2014-12-23 NOTE — Patient Instructions (Signed)

## 2015-01-20 ENCOUNTER — Ambulatory Visit
Admission: RE | Admit: 2015-01-20 | Discharge: 2015-01-20 | Disposition: A | Discharge status: Home / Self Care | Payer: BLUE CROSS/BLUE SHIELD | Source: Ambulatory Visit | Attending: Internal Medicine | Admitting: Internal Medicine

## 2015-01-20 DIAGNOSIS — R921 Mammographic calcification found on diagnostic imaging of breast: Secondary | ICD-10-CM

## 2015-04-27 DIAGNOSIS — M899 Disorder of bone, unspecified: Secondary | ICD-10-CM | POA: Insufficient documentation

## 2015-05-07 ENCOUNTER — Other Ambulatory Visit: Payer: Self-pay | Admitting: Certified Nurse Midwife

## 2015-05-07 MED ORDER — VALACYCLOVIR HCL 500 MG PO TABS: 500.0000 mg | ORAL_TABLET | Freq: Two times a day (BID) | ORAL | Status: DC

## 2015-05-07 NOTE — Telephone Encounter (Signed)
Medication refill request: Valtrex Last AEX:  09-22-14 Next AEX: 12-24-15 Last MMG (if hormonal medication request): 01-20-15 Right breast diagnostic mammogram, with magnification views, in 6 months. Refill authorized: please advise

## 2015-05-07 NOTE — Telephone Encounter (Signed)
Patient calling for prescription for generic Valtrex sent to [facility] on [address] at [phone].

## 2015-05-10 DIAGNOSIS — Z794 Long term (current) use of insulin: Secondary | ICD-10-CM | POA: Insufficient documentation

## 2015-06-16 ENCOUNTER — Other Ambulatory Visit: Payer: Self-pay | Admitting: Internal Medicine

## 2015-06-16 DIAGNOSIS — R921 Mammographic calcification found on diagnostic imaging of breast: Secondary | ICD-10-CM

## 2015-07-15 ENCOUNTER — Ambulatory Visit
Admission: RE | Admit: 2015-07-15 | Discharge: 2015-07-15 | Disposition: A | Discharge status: Home / Self Care | Payer: BLUE CROSS/BLUE SHIELD | Source: Ambulatory Visit | Attending: Internal Medicine | Admitting: Internal Medicine

## 2015-07-15 DIAGNOSIS — R922 Inconclusive mammogram: Secondary | ICD-10-CM | POA: Diagnosis not present

## 2015-07-15 DIAGNOSIS — R921 Mammographic calcification found on diagnostic imaging of breast: Secondary | ICD-10-CM

## 2015-08-27 DIAGNOSIS — M8580 Other specified disorders of bone density and structure, unspecified site: Secondary | ICD-10-CM | POA: Diagnosis not present

## 2015-08-27 DIAGNOSIS — E119 Type 2 diabetes mellitus without complications: Secondary | ICD-10-CM | POA: Diagnosis not present

## 2015-08-27 DIAGNOSIS — Z6822 Body mass index (BMI) 22.0-22.9, adult: Secondary | ICD-10-CM | POA: Diagnosis not present

## 2015-08-27 DIAGNOSIS — E784 Other hyperlipidemia: Secondary | ICD-10-CM | POA: Diagnosis not present

## 2015-09-01 DIAGNOSIS — E119 Type 2 diabetes mellitus without complications: Secondary | ICD-10-CM | POA: Diagnosis not present

## 2015-09-01 DIAGNOSIS — Z794 Long term (current) use of insulin: Secondary | ICD-10-CM | POA: Diagnosis not present

## 2015-09-01 DIAGNOSIS — M859 Disorder of bone density and structure, unspecified: Secondary | ICD-10-CM | POA: Diagnosis not present

## 2015-12-24 ENCOUNTER — Encounter: Payer: Self-pay | Admitting: Certified Nurse Midwife

## 2015-12-24 ENCOUNTER — Ambulatory Visit (INDEPENDENT_AMBULATORY_CARE_PROVIDER_SITE_OTHER): Payer: BLUE CROSS/BLUE SHIELD | Admitting: Certified Nurse Midwife

## 2015-12-24 VITALS — BP 110/70 | HR 66 | Resp 12 | Ht 65.5 in | Wt 138.0 lb

## 2015-12-24 DIAGNOSIS — Z1151 Encounter for screening for human papillomavirus (HPV): Secondary | ICD-10-CM | POA: Diagnosis not present

## 2015-12-24 DIAGNOSIS — Z01419 Encounter for gynecological examination (general) (routine) without abnormal findings: Secondary | ICD-10-CM | POA: Diagnosis not present

## 2015-12-24 DIAGNOSIS — Z124 Encounter for screening for malignant neoplasm of cervix: Secondary | ICD-10-CM

## 2015-12-24 NOTE — Progress Notes (Signed)
55 y.o. [id number] Married  Caucasian Fe here for annual exam. Menopausal no HRT. Denies vaginal bleeding or dryness. Sees PCP(Ava) for diabetes, hyperlipidemia management/labs/aex.. All stable per patient. Exercising and eating per diet. No HSV outbreak this year. Using coconut oil for vaginal dryness working well. No other health issues today. Took a trip with daughters to the [place]   Patient's last menstrual period was 10/10/2007.          Sexually active: Yes.    The current method of family planning is post menopausal status.    Exercising: Yes.    Tennis, Walking, Barre Smoker:  no  Health Maintenance: Pap:  12-17-13 neg MMG:  07-15-15 category c birads 3:prob benign Colonoscopy:  2/14 neg f/u 1376yrs BMD:   9/16 osteopenia PCP management TDaP:  2008  Shingles: No Pneumonia: No Hep C and HIV: PCP Labs: PCP   reports that she has never smoked. She has never used smokeless tobacco. She reports that she drinks about 1.2 - 1.8 oz of alcohol per week . She reports that she does not use drugs.  Past Medical History:  Diagnosis Date  . Diabetes mellitus without complication (HCC)    type 1  . Hyperlipidemia   . STD (sexually transmitted disease)    HSV1    Past Surgical History:  Procedure Laterality Date  . INGUINAL HERNIA REPAIR  1999   left  . LAPAROSCOPY FOR ECTOPIC PREGNANCY  1993 & 1992  . TUBAL LIGATION  1999    Current Outpatient Prescriptions  Medication Sig Dispense Refill  . Insulin Degludec (TRESIBA FLEXTOUCH) 100 UNIT/ML SOPN Inject into the skin.    . metFORMIN (GLUCOPHAGE-XR) 500 MG 24 hr tablet Take 1,000 mg by mouth.    Marland Kitchen. NOVOFINE 30G X 8 MM MISC daily.  11  . NOVOLOG FLEXPEN 100 UNIT/ML FlexPen INJECT 5-15 UNITS SUBCUTANEOUSLY THREE TIMES DAILY AS DIRECTED BEFORE EVERY MEAL  12  . ONE TOUCH ULTRA TEST test strip USE ONE STRIP PER TEST 5 TIMES DAILY OR AS DIRECTED  6  . simvastatin (ZOCOR) 40 MG tablet Take 40 mg by mouth daily.  4  . valACYclovir (VALTREX)  500 MG tablet Take 1 tablet (500 mg total) by mouth 2 (two) times daily. 30 tablet 6   No current facility-administered medications for this visit.     Family History  Problem Relation Age of Onset  . Hypertension Father   . Cancer Father     prostate  . Parkinson's disease Father   . Breast cancer Maternal Aunt   . Breast cancer Paternal Aunt   . Diabetes Maternal Grandfather   . Osteoarthritis Mother     ROS:  Pertinent items are noted in HPI.  Otherwise, a comprehensive ROS was negative.  Exam:   BP 110/70 (BP Location: Right Arm, Patient Position: Sitting, Cuff Size: Normal)   Pulse 66   Resp 12   Ht 5' 5.5" (1.664 m)   Wt 138 lb (62.6 kg)   LMP 10/10/2007   BMI 22.62 kg/m  Height: 5' 5.5" (166.4 cm) Ht Readings from Last 3 Encounters:  12/24/15 5' 5.5" (1.664 m)  12/23/14 5' 5.5" (1.664 m)  12/17/13 5' 5.75" (1.67 m)    General appearance: alert, cooperative and appears stated age Head: Normocephalic, without obvious abnormality, atraumatic Neck: no adenopathy, supple, symmetrical, trachea midline and thyroid normal to inspection and palpation Lungs: clear to auscultation bilaterally Breasts: normal appearance, no masses or tenderness, No nipple retraction or dimpling, No nipple  discharge or bleeding, No axillary or supraclavicular adenopathy Heart: regular rate and rhythm Abdomen: soft, non-tender; no masses,  no organomegaly Extremities: extremities normal, atraumatic, no cyanosis or edema Skin: Skin color, texture, turgor normal. No rashes or lesions Lymph nodes: Cervical, supraclavicular, and axillary nodes normal. No abnormal inguinal nodes palpated Neurologic: Grossly normal   Pelvic: External genitalia:  no lesions              Urethra:  normal appearing urethra with no masses, tenderness or lesions              Bartholin's and Skene's: normal                 Vagina: normal appearing vagina with normal color and discharge, no lesions               Cervix: multiparous appearance, no bleeding following Pap, no cervical motion tenderness and no lesions              Pap taken: Yes.   Bimanual Exam:  Uterus:  normal size, contour, position, consistency, mobility, non-tender              Adnexa: normal adnexa and no mass, fullness, tenderness               Rectovaginal: Confirms               Anus:  normal sphincter tone, no lesions  Chaperone present: yes  A:  Well Woman with normal exam  Menopausal no HRT  Type 1 diabetes good control with PCP managment  P:   Reviewed health and wellness pertinent to exam  Aware of need to evaluate if vaginal bleeding  Continue follow up as indicated  Pap smear as above with HPVHR   counseled on breast self exam, mammography screening, menopause, adequate intake of calcium and vitamin D, diet and exercise  return annually or prn  An After Visit Summary was printed and given to the patient.

## 2015-12-24 NOTE — Patient Instructions (Signed)

## 2015-12-27 NOTE — Progress Notes (Signed)
Encounter reviewed Ottavio Norem, MD   

## 2015-12-29 LAB — IPS PAP TEST WITH HPV

## 2016-01-28 DIAGNOSIS — Z23 Encounter for immunization: Secondary | ICD-10-CM | POA: Diagnosis not present

## 2016-01-28 DIAGNOSIS — M8580 Other specified disorders of bone density and structure, unspecified site: Secondary | ICD-10-CM | POA: Diagnosis not present

## 2016-01-28 DIAGNOSIS — E119 Type 2 diabetes mellitus without complications: Secondary | ICD-10-CM | POA: Diagnosis not present

## 2016-01-28 DIAGNOSIS — E784 Other hyperlipidemia: Secondary | ICD-10-CM | POA: Diagnosis not present

## 2016-05-06 DIAGNOSIS — E784 Other hyperlipidemia: Secondary | ICD-10-CM | POA: Diagnosis not present

## 2016-05-06 DIAGNOSIS — M859 Disorder of bone density and structure, unspecified: Secondary | ICD-10-CM | POA: Diagnosis not present

## 2016-05-06 DIAGNOSIS — R8299 Other abnormal findings in urine: Secondary | ICD-10-CM | POA: Diagnosis not present

## 2016-05-06 DIAGNOSIS — E119 Type 2 diabetes mellitus without complications: Secondary | ICD-10-CM | POA: Diagnosis not present

## 2016-05-13 DIAGNOSIS — E784 Other hyperlipidemia: Secondary | ICD-10-CM | POA: Diagnosis not present

## 2016-05-13 DIAGNOSIS — Z0000 Encounter for general adult medical examination without abnormal findings: Secondary | ICD-10-CM | POA: Diagnosis not present

## 2016-05-13 DIAGNOSIS — E162 Hypoglycemia, unspecified: Secondary | ICD-10-CM | POA: Diagnosis not present

## 2016-05-13 DIAGNOSIS — Z1389 Encounter for screening for other disorder: Secondary | ICD-10-CM | POA: Diagnosis not present

## 2016-05-13 DIAGNOSIS — Z794 Long term (current) use of insulin: Secondary | ICD-10-CM | POA: Diagnosis not present

## 2016-05-13 DIAGNOSIS — E119 Type 2 diabetes mellitus without complications: Secondary | ICD-10-CM | POA: Diagnosis not present

## 2016-05-20 DIAGNOSIS — R509 Fever, unspecified: Secondary | ICD-10-CM | POA: Diagnosis not present

## 2016-05-31 DIAGNOSIS — Z1212 Encounter for screening for malignant neoplasm of rectum: Secondary | ICD-10-CM | POA: Diagnosis not present

## 2016-06-02 DIAGNOSIS — H2513 Age-related nuclear cataract, bilateral: Secondary | ICD-10-CM | POA: Diagnosis not present

## 2016-06-02 DIAGNOSIS — H10413 Chronic giant papillary conjunctivitis, bilateral: Secondary | ICD-10-CM | POA: Diagnosis not present

## 2016-06-02 DIAGNOSIS — E109 Type 1 diabetes mellitus without complications: Secondary | ICD-10-CM | POA: Diagnosis not present

## 2016-06-02 DIAGNOSIS — H04123 Dry eye syndrome of bilateral lacrimal glands: Secondary | ICD-10-CM | POA: Diagnosis not present

## 2016-06-09 ENCOUNTER — Other Ambulatory Visit: Payer: Self-pay | Admitting: Internal Medicine

## 2016-06-09 DIAGNOSIS — R921 Mammographic calcification found on diagnostic imaging of breast: Secondary | ICD-10-CM

## 2016-07-18 ENCOUNTER — Ambulatory Visit
Admission: RE | Admit: 2016-07-18 | Discharge: 2016-07-18 | Disposition: A | Discharge status: Home / Self Care | Payer: BLUE CROSS/BLUE SHIELD | Source: Ambulatory Visit | Attending: Internal Medicine | Admitting: Internal Medicine

## 2016-07-18 DIAGNOSIS — R921 Mammographic calcification found on diagnostic imaging of breast: Secondary | ICD-10-CM

## 2016-09-22 DIAGNOSIS — E119 Type 2 diabetes mellitus without complications: Secondary | ICD-10-CM | POA: Diagnosis not present

## 2016-09-22 DIAGNOSIS — Z23 Encounter for immunization: Secondary | ICD-10-CM | POA: Diagnosis not present

## 2016-09-22 DIAGNOSIS — E784 Other hyperlipidemia: Secondary | ICD-10-CM | POA: Diagnosis not present

## 2016-09-22 DIAGNOSIS — M859 Disorder of bone density and structure, unspecified: Secondary | ICD-10-CM | POA: Diagnosis not present

## 2016-09-22 DIAGNOSIS — E162 Hypoglycemia, unspecified: Secondary | ICD-10-CM | POA: Diagnosis not present

## 2016-10-27 DIAGNOSIS — H5210 Myopia, unspecified eye: Secondary | ICD-10-CM | POA: Diagnosis not present

## 2016-12-27 ENCOUNTER — Encounter: Payer: Self-pay | Admitting: Certified Nurse Midwife

## 2016-12-27 ENCOUNTER — Ambulatory Visit: Payer: BLUE CROSS/BLUE SHIELD | Admitting: Certified Nurse Midwife

## 2016-12-27 VITALS — BP 104/64 | HR 70 | Resp 16 | Ht 65.5 in | Wt 138.0 lb

## 2016-12-27 DIAGNOSIS — N951 Menopausal and female climacteric states: Secondary | ICD-10-CM | POA: Diagnosis not present

## 2016-12-27 DIAGNOSIS — B009 Herpesviral infection, unspecified: Secondary | ICD-10-CM | POA: Diagnosis not present

## 2016-12-27 DIAGNOSIS — Z01419 Encounter for gynecological examination (general) (routine) without abnormal findings: Secondary | ICD-10-CM

## 2016-12-27 MED ORDER — VALACYCLOVIR HCL 500 MG PO TABS
500.0000 mg | ORAL_TABLET | Freq: Two times a day (BID) | ORAL | 6 refills | Status: DC
Start: 2016-12-27 — End: 2017-01-23

## 2016-12-27 NOTE — Patient Instructions (Signed)

## 2016-12-27 NOTE — Progress Notes (Signed)
56 y.o. [id number] Married  Caucasian Fe here for annual exam. Menopausal no HRT. Denies vaginal bleeding. Using coconut oil for vaginal dryness with good results. Sees Dr. Felipa Eth for diabetes management and cholesterol, all medications stable and labs.. Using Valtrex prn for HSV if needed. Need refill. No major problems in the past year. Did have flu this past year.   Patient's last menstrual period was 10/10/2007.          Sexually active: Yes.    The current method of family planning is post menopausal status & BTL. Exercising: Yes.    tennis, walking,spin, barre Smoker:  no  Health Maintenance: Pap:  12-17-13 neg, 12-24-15 neg HPV HR neg History of Abnormal Pap: yes over 41yrs ago MMG: 07-18-16 category c density birads 2:neg Self Breast exams: yes Colonoscopy:  2/14 neg f/u 78yrs BMD:   9/16 osteopenia pcp management TDaP:  2018 Shingles: no Pneumonia: no Hep C and HIV: with pcp Labs: pcp   reports that she has never smoked. She has never used smokeless tobacco. She reports that she drinks about 2.4 oz of alcohol per week . She reports that she does not use drugs.  Past Medical History:  Diagnosis Date  . Diabetes mellitus without complication (HCC)    type 1  . Hyperlipidemia   . STD (sexually transmitted disease)    HSV1    Past Surgical History:  Procedure Laterality Date  . BREAST BIOPSY Left 09/16/1999  . INGUINAL HERNIA REPAIR  1999   left  . LAPAROSCOPY FOR ECTOPIC PREGNANCY  1993 & 1992  . TUBAL LIGATION  1999    Current Outpatient Prescriptions  Medication Sig Dispense Refill  . Insulin Degludec (TRESIBA FLEXTOUCH) 100 UNIT/ML SOPN Inject into the skin.    . metFORMIN (GLUCOPHAGE-XR) 500 MG 24 hr tablet Take 500 mg by mouth.     Marland Kitchen NOVOFINE 30G X 8 MM MISC daily.  11  . NOVOLOG FLEXPEN 100 UNIT/ML FlexPen INJECT 5-15 UNITS SUBCUTANEOUSLY THREE TIMES DAILY AS DIRECTED BEFORE EVERY MEAL  12  . ONE TOUCH ULTRA TEST test strip USE ONE STRIP PER TEST 5 TIMES DAILY OR AS  DIRECTED  6  . simvastatin (ZOCOR) 40 MG tablet Take 40 mg by mouth daily.  4  . valACYclovir (VALTREX) 500 MG tablet Take 1 tablet (500 mg total) by mouth 2 (two) times daily. 30 tablet 6   No current facility-administered medications for this visit.     Family History  Problem Relation Age of Onset  . Hypertension Father   . Cancer Father        prostate  . Parkinson's disease Father   . Breast cancer Maternal Aunt 60  . Breast cancer Paternal Aunt 61  . Diabetes Maternal Grandfather   . Osteoarthritis Mother     ROS:  Pertinent items are noted in HPI.  Otherwise, a comprehensive ROS was negative.  Exam:   BP 104/64   Pulse 70   Resp 16   Ht 5' 5.5" (1.664 m)   Wt 138 lb (62.6 kg)   LMP 10/10/2007   BMI 22.62 kg/m  Height: 5' 5.5" (166.4 cm) Ht Readings from Last 3 Encounters:  12/27/16 5' 5.5" (1.664 m)  12/24/15 5' 5.5" (1.664 m)  12/23/14 5' 5.5" (1.664 m)    General appearance: alert, cooperative and appears stated age Head: Normocephalic, without obvious abnormality, atraumatic Neck: no adenopathy, supple, symmetrical, trachea midline and thyroid normal to inspection and palpation Lungs: clear to auscultation bilaterally  Breasts: normal appearance, no masses or tenderness, No nipple retraction or dimpling, No nipple discharge or bleeding, No axillary or supraclavicular adenopathy Heart: regular rate and rhythm Abdomen: soft, non-tender; no masses,  no organomegaly Extremities: extremities normal, atraumatic, no cyanosis or edema Skin: Skin color, texture, turgor normal. No rashes or lesions Lymph nodes: Cervical, supraclavicular, and axillary nodes normal. No abnormal inguinal nodes palpated Neurologic: Grossly normal   Pelvic: External genitalia:  no lesions              Urethra:  normal appearing urethra with no masses, tenderness or lesions              Bartholin's and Skene's: normal                 Vagina: normal appearing vagina with normal color and  discharge, no lesions              Cervix: no cervical motion tenderness, no lesions and normal appearance              Pap taken: No. Bimanual Exam:  Uterus:  normal size, contour, position, consistency, mobility, non-tender              Adnexa: normal adnexa and no mass, fullness, tenderness               Rectovaginal: Confirms               Anus:  normal sphincter tone, no lesions  Chaperone present: yes  A:  Well Woman with normal exam  Menopausal no HRT  Vaginal dryness coconut oil working well  HSV oral history Valtrex working well  Therapist, art with PCP  P:   Reviewed health and wellness pertinent to exam  Aware of need to evaluate if vaginal bleeding  Rx Valtrex see orders with instructions  Continue follow up with PCP as indicated.  Stressed flu vaccine this year due to occurrence last year  Pap smear: no   counseled on breast self exam, mammography screening, feminine hygiene, adequate intake of calcium and vitamin D, diet and exercise  return annually or prn  An After Visit Summary was printed and given to the patient.

## 2017-01-05 DIAGNOSIS — D485 Neoplasm of uncertain behavior of skin: Secondary | ICD-10-CM | POA: Diagnosis not present

## 2017-01-05 DIAGNOSIS — D225 Melanocytic nevi of trunk: Secondary | ICD-10-CM | POA: Diagnosis not present

## 2017-01-05 DIAGNOSIS — L821 Other seborrheic keratosis: Secondary | ICD-10-CM | POA: Diagnosis not present

## 2017-01-05 DIAGNOSIS — L570 Actinic keratosis: Secondary | ICD-10-CM | POA: Diagnosis not present

## 2017-01-05 DIAGNOSIS — D2271 Melanocytic nevi of right lower limb, including hip: Secondary | ICD-10-CM | POA: Diagnosis not present

## 2017-01-05 DIAGNOSIS — D2261 Melanocytic nevi of right upper limb, including shoulder: Secondary | ICD-10-CM | POA: Diagnosis not present

## 2017-01-23 ENCOUNTER — Telehealth: Payer: Self-pay | Admitting: Certified Nurse Midwife

## 2017-01-23 DIAGNOSIS — B009 Herpesviral infection, unspecified: Secondary | ICD-10-CM

## 2017-01-23 MED ORDER — VALACYCLOVIR HCL 500 MG PO TABS: ORAL_TABLET | ORAL | 2 refills | Status: DC

## 2017-01-23 NOTE — Telephone Encounter (Signed)
Medication refill request:Valtrex  Last AEX:  12-27-16  Next AEX: 12-28-17  Last MMG (if hormonal medication request): 07-18-16 WNL  Refill authorized: please advise   Patient requesting RX today

## 2017-01-23 NOTE — Telephone Encounter (Addendum)
Patient is requesting a refill of Valtrex. [facility]  [address] [place], [place] [phone]  *Patient is asking if this could be called to her pharmacy today if possible. Patient said she really needs to start this prescription today.

## 2017-01-23 NOTE — Telephone Encounter (Signed)
See previous telephone encounter dated 01/23/17 for refill request. Patient notified, RX sent to pharmacy.   Routing to provider for final review. Will close encounter.

## 2017-01-23 NOTE — Telephone Encounter (Signed)
Patient left message that  she is out of town and needs a script sent to where she is.  I attempted to call patient but went to voicemail.

## 2017-01-23 NOTE — Telephone Encounter (Signed)
Spoke with patient informed that RX was sent in -eh

## 2017-01-31 DIAGNOSIS — E162 Hypoglycemia, unspecified: Secondary | ICD-10-CM | POA: Diagnosis not present

## 2017-01-31 DIAGNOSIS — E119 Type 2 diabetes mellitus without complications: Secondary | ICD-10-CM | POA: Diagnosis not present

## 2017-01-31 DIAGNOSIS — M859 Disorder of bone density and structure, unspecified: Secondary | ICD-10-CM | POA: Diagnosis not present

## 2017-01-31 DIAGNOSIS — Z6822 Body mass index (BMI) 22.0-22.9, adult: Secondary | ICD-10-CM | POA: Diagnosis not present

## 2017-01-31 DIAGNOSIS — E7849 Other hyperlipidemia: Secondary | ICD-10-CM | POA: Diagnosis not present

## 2017-05-17 DIAGNOSIS — E7849 Other hyperlipidemia: Secondary | ICD-10-CM | POA: Diagnosis not present

## 2017-05-17 DIAGNOSIS — E119 Type 2 diabetes mellitus without complications: Secondary | ICD-10-CM | POA: Diagnosis not present

## 2017-05-17 DIAGNOSIS — R82998 Other abnormal findings in urine: Secondary | ICD-10-CM | POA: Diagnosis not present

## 2017-05-17 DIAGNOSIS — Z0000 Encounter for general adult medical examination without abnormal findings: Secondary | ICD-10-CM | POA: Diagnosis not present

## 2017-05-17 DIAGNOSIS — M859 Disorder of bone density and structure, unspecified: Secondary | ICD-10-CM | POA: Diagnosis not present

## 2017-05-18 DIAGNOSIS — E119 Type 2 diabetes mellitus without complications: Secondary | ICD-10-CM | POA: Diagnosis not present

## 2017-05-25 DIAGNOSIS — E162 Hypoglycemia, unspecified: Secondary | ICD-10-CM | POA: Diagnosis not present

## 2017-05-25 DIAGNOSIS — R05 Cough: Secondary | ICD-10-CM | POA: Diagnosis not present

## 2017-05-25 DIAGNOSIS — Z1389 Encounter for screening for other disorder: Secondary | ICD-10-CM | POA: Diagnosis not present

## 2017-05-25 DIAGNOSIS — E119 Type 2 diabetes mellitus without complications: Secondary | ICD-10-CM | POA: Diagnosis not present

## 2017-05-25 DIAGNOSIS — R509 Fever, unspecified: Secondary | ICD-10-CM | POA: Diagnosis not present

## 2017-05-25 DIAGNOSIS — Z0000 Encounter for general adult medical examination without abnormal findings: Secondary | ICD-10-CM | POA: Diagnosis not present

## 2017-06-01 DIAGNOSIS — Z1212 Encounter for screening for malignant neoplasm of rectum: Secondary | ICD-10-CM | POA: Diagnosis not present

## 2017-06-08 DIAGNOSIS — H2513 Age-related nuclear cataract, bilateral: Secondary | ICD-10-CM | POA: Diagnosis not present

## 2017-06-08 DIAGNOSIS — H04123 Dry eye syndrome of bilateral lacrimal glands: Secondary | ICD-10-CM | POA: Diagnosis not present

## 2017-06-08 DIAGNOSIS — E109 Type 1 diabetes mellitus without complications: Secondary | ICD-10-CM | POA: Diagnosis not present

## 2017-06-09 ENCOUNTER — Other Ambulatory Visit: Payer: Self-pay | Admitting: Internal Medicine

## 2017-06-09 DIAGNOSIS — Z1231 Encounter for screening mammogram for malignant neoplasm of breast: Secondary | ICD-10-CM

## 2017-07-19 ENCOUNTER — Ambulatory Visit: Payer: BLUE CROSS/BLUE SHIELD

## 2017-09-05 ENCOUNTER — Ambulatory Visit
Admission: RE | Admit: 2017-09-05 | Discharge: 2017-09-05 | Disposition: A | Discharge status: Home / Self Care | Payer: BLUE CROSS/BLUE SHIELD | Source: Ambulatory Visit | Attending: Internal Medicine | Admitting: Internal Medicine

## 2017-09-05 DIAGNOSIS — Z1231 Encounter for screening mammogram for malignant neoplasm of breast: Secondary | ICD-10-CM | POA: Diagnosis not present

## 2017-10-17 DIAGNOSIS — L209 Atopic dermatitis, unspecified: Secondary | ICD-10-CM | POA: Insufficient documentation

## 2017-10-17 DIAGNOSIS — M859 Disorder of bone density and structure, unspecified: Secondary | ICD-10-CM | POA: Diagnosis not present

## 2017-10-17 DIAGNOSIS — E162 Hypoglycemia, unspecified: Secondary | ICD-10-CM | POA: Diagnosis not present

## 2017-10-17 DIAGNOSIS — E119 Type 2 diabetes mellitus without complications: Secondary | ICD-10-CM | POA: Diagnosis not present

## 2017-10-17 DIAGNOSIS — E7849 Other hyperlipidemia: Secondary | ICD-10-CM | POA: Diagnosis not present

## 2017-11-28 DIAGNOSIS — Z794 Long term (current) use of insulin: Secondary | ICD-10-CM | POA: Diagnosis not present

## 2017-11-28 DIAGNOSIS — E119 Type 2 diabetes mellitus without complications: Secondary | ICD-10-CM | POA: Diagnosis not present

## 2017-12-18 DIAGNOSIS — Z6822 Body mass index (BMI) 22.0-22.9, adult: Secondary | ICD-10-CM | POA: Diagnosis not present

## 2017-12-18 DIAGNOSIS — E119 Type 2 diabetes mellitus without complications: Secondary | ICD-10-CM | POA: Diagnosis not present

## 2017-12-18 DIAGNOSIS — Z794 Long term (current) use of insulin: Secondary | ICD-10-CM | POA: Diagnosis not present

## 2017-12-28 ENCOUNTER — Ambulatory Visit: Payer: BLUE CROSS/BLUE SHIELD | Admitting: Certified Nurse Midwife

## 2017-12-28 ENCOUNTER — Other Ambulatory Visit: Payer: Self-pay

## 2017-12-28 ENCOUNTER — Encounter: Payer: Self-pay | Admitting: Certified Nurse Midwife

## 2017-12-28 VITALS — BP 90/64 | HR 68 | Resp 16 | Ht 65.25 in | Wt 132.0 lb

## 2017-12-28 DIAGNOSIS — B009 Herpesviral infection, unspecified: Secondary | ICD-10-CM

## 2017-12-28 DIAGNOSIS — N951 Menopausal and female climacteric states: Secondary | ICD-10-CM

## 2017-12-28 DIAGNOSIS — Z01419 Encounter for gynecological examination (general) (routine) without abnormal findings: Secondary | ICD-10-CM

## 2017-12-28 MED ORDER — VALACYCLOVIR HCL 500 MG PO TABS: ORAL_TABLET | ORAL | 2 refills | Status: AC

## 2017-12-28 NOTE — Patient Instructions (Signed)

## 2017-12-28 NOTE — Progress Notes (Signed)
57 y.o. [id number] Married  Caucasian Fe here for annual exam. Menopausal no HRT. Was unsure of continuous glucose monitor use and now has one. Now on day 10 day of use. She was previously having numerous hypoglycemic episodes. Recent tooth issues with antibiotic use. Working with her endocrine regarding use. Not adjusting well with it so far, but plans to continue. Seeing Dr. Felipa EthAvva for management of cholesterol and diabetes. Has had occasional HSV 1 outbreak on buttocks, Valtrex working well. Denies vaginal bleeding or vaginal dryness.  Patient's last menstrual period was 10/10/2007.          Sexually active: Yes.    The current method of family planning is tubal ligation.    Exercising: Yes.    tennis, walking, yoga Smoker:  no  Review of Systems  Constitutional: Negative.   HENT: Negative.   Eyes: Negative.   Respiratory: Negative.   Cardiovascular: Negative.   Gastrointestinal: Negative.   Genitourinary: Negative.   Musculoskeletal: Negative.   Skin: Negative.   Neurological: Negative.   Endo/Heme/Allergies: Negative.   Psychiatric/Behavioral: Negative.     Health Maintenance: Pap:  12-24-15 neg HPV HR neg History of Abnormal Pap: yes MMG:  09-05-17 category c density birads 1:neg Self Breast exams: yes Colonoscopy:  2014 neg f/u 5036yrs BMD:   6/19 pcp manages TDaP:  2018 Shingles: not done Pneumonia: not done Hep C and HIV: both neg yrs ago Labs: with PCP   reports that she has never smoked. She has never used smokeless tobacco. She reports that she drinks about 4.0 standard drinks of alcohol per week. She reports that she does not use drugs.  Past Medical History:  Diagnosis Date  . Abnormal Pap smear of cervix    over 20 yrs ago  . Diabetes mellitus without complication (HCC)    type 1  . Hyperlipidemia   . STD (sexually transmitted disease)    HSV1    Past Surgical History:  Procedure Laterality Date  . BREAST BIOPSY Left 09/16/1999  . CRYOTHERAPY     over 5195yrs  ago  . INGUINAL HERNIA REPAIR  1999   left  . LAPAROSCOPY FOR ECTOPIC PREGNANCY  1993 & 1992  . TUBAL LIGATION  1999    Current Outpatient Medications  Medication Sig Dispense Refill  . Insulin Degludec (TRESIBA FLEXTOUCH) 100 UNIT/ML SOPN Inject into the skin.    . metFORMIN (GLUCOPHAGE-XR) 500 MG 24 hr tablet Take 500 mg by mouth.     Marland Kitchen. NOVOFINE 30G X 8 MM MISC daily.  11  . NOVOLOG FLEXPEN 100 UNIT/ML FlexPen INJECT 5-15 UNITS SUBCUTANEOUSLY THREE TIMES DAILY AS DIRECTED BEFORE EVERY MEAL  12  . ONE TOUCH ULTRA TEST test strip USE ONE STRIP PER TEST 5 TIMES DAILY OR AS DIRECTED  6  . simvastatin (ZOCOR) 40 MG tablet Take 40 mg by mouth daily.  4  . valACYclovir (VALTREX) 500 MG tablet Take one tablet bid for 3 days prn 30 tablet 2   No current facility-administered medications for this visit.     Family History  Problem Relation Age of Onset  . Hypertension Father   . Cancer Father        prostate  . Parkinson's disease Father   . Breast cancer Maternal Aunt 60  . Breast cancer Paternal Aunt [id number]  . Diabetes Maternal Grandfather   . Osteoarthritis Mother     ROS:  Pertinent items are noted in HPI.  Otherwise, a comprehensive ROS was negative.  Exam:  LMP 10/10/2007    Ht Readings from Last 3 Encounters:  12/27/16 5' 5.5" (1.664 m)  12/24/15 5' 5.5" (1.664 m)  12/23/14 5' 5.5" (1.664 m)    General appearance: alert, cooperative and appears stated age Head: Normocephalic, without obvious abnormality, atraumatic Neck: no adenopathy, supple, symmetrical, trachea midline and thyroid normal to inspection and palpation Lungs: clear to auscultation bilaterally Breasts: normal appearance, no masses or tenderness, No nipple retraction or dimpling, No nipple discharge or bleeding, No axillary or supraclavicular adenopathy Heart: regular rate and rhythm Abdomen: soft, non-tender; no masses,  no organomegaly Extremities: extremities normal, atraumatic, no cyanosis or  edema Skin: Skin color, texture, turgor normal. No rashes or lesions Lymph nodes: Cervical, supraclavicular, and axillary nodes normal. No abnormal inguinal nodes palpated Neurologic: Grossly normal   Pelvic: External genitalia:  no lesions, normal female              Urethra:  normal appearing urethra with no masses, tenderness or lesions              Bartholin's and Skene's: normal                 Vagina: normal appearing vagina with normal color and discharge, no lesions              Cervix: multiparous appearance, no cervical motion tenderness and no lesions              Pap taken: No. Bimanual Exam:  Uterus:  normal size, contour, position, consistency, mobility, non-tender and anteverted              Adnexa: normal adnexa and no mass, fullness, tenderness               Rectovaginal: Confirms               Anus:  normal sphincter tone, no lesions  Chaperone present: yes  A:  Well Woman with normal exam  Menopausal no HRT  Type 1 diabetic on continuous insulin pump with PCP management    P:   Reviewed health and wellness pertinent to exam  Aware of need to advise if vaginal bleeding  Encouraged to be consistent with use of pump and keep follow up with PCP  HSV 1 , Rx Valtrex updated see order with instructions  Pap smear: no   counseled on breast self exam, mammography screening, osteoporosis, adequate intake of calcium and vitamin D, diet and exercise  return annually or prn  An After Visit Summary was printed and given to the patient.

## 2018-01-26 DIAGNOSIS — L821 Other seborrheic keratosis: Secondary | ICD-10-CM | POA: Diagnosis not present

## 2018-01-26 DIAGNOSIS — L84 Corns and callosities: Secondary | ICD-10-CM | POA: Diagnosis not present

## 2018-01-26 DIAGNOSIS — B078 Other viral warts: Secondary | ICD-10-CM | POA: Diagnosis not present

## 2018-01-26 DIAGNOSIS — L814 Other melanin hyperpigmentation: Secondary | ICD-10-CM | POA: Diagnosis not present

## 2018-02-21 DIAGNOSIS — E119 Type 2 diabetes mellitus without complications: Secondary | ICD-10-CM | POA: Diagnosis not present

## 2018-02-21 DIAGNOSIS — Z794 Long term (current) use of insulin: Secondary | ICD-10-CM | POA: Diagnosis not present

## 2018-06-27 DIAGNOSIS — E119 Type 2 diabetes mellitus without complications: Secondary | ICD-10-CM | POA: Diagnosis not present

## 2018-06-27 DIAGNOSIS — H2513 Age-related nuclear cataract, bilateral: Secondary | ICD-10-CM | POA: Diagnosis not present

## 2018-06-27 DIAGNOSIS — H04123 Dry eye syndrome of bilateral lacrimal glands: Secondary | ICD-10-CM | POA: Diagnosis not present

## 2018-06-27 DIAGNOSIS — Z794 Long term (current) use of insulin: Secondary | ICD-10-CM | POA: Diagnosis not present

## 2018-07-04 DIAGNOSIS — Z0000 Encounter for general adult medical examination without abnormal findings: Secondary | ICD-10-CM | POA: Diagnosis not present

## 2018-07-04 DIAGNOSIS — E7849 Other hyperlipidemia: Secondary | ICD-10-CM | POA: Diagnosis not present

## 2018-07-04 DIAGNOSIS — E119 Type 2 diabetes mellitus without complications: Secondary | ICD-10-CM | POA: Diagnosis not present

## 2018-07-04 DIAGNOSIS — M859 Disorder of bone density and structure, unspecified: Secondary | ICD-10-CM | POA: Diagnosis not present

## 2018-07-12 DIAGNOSIS — Z0000 Encounter for general adult medical examination without abnormal findings: Secondary | ICD-10-CM | POA: Diagnosis not present

## 2018-07-12 DIAGNOSIS — E785 Hyperlipidemia, unspecified: Secondary | ICD-10-CM | POA: Diagnosis not present

## 2018-07-12 DIAGNOSIS — E119 Type 2 diabetes mellitus without complications: Secondary | ICD-10-CM | POA: Diagnosis not present

## 2018-07-12 DIAGNOSIS — Z794 Long term (current) use of insulin: Secondary | ICD-10-CM | POA: Diagnosis not present

## 2018-07-12 DIAGNOSIS — Z1331 Encounter for screening for depression: Secondary | ICD-10-CM | POA: Diagnosis not present

## 2018-07-12 DIAGNOSIS — R82998 Other abnormal findings in urine: Secondary | ICD-10-CM | POA: Diagnosis not present

## 2018-07-12 DIAGNOSIS — L03039 Cellulitis of unspecified toe: Secondary | ICD-10-CM | POA: Diagnosis not present

## 2018-08-02 ENCOUNTER — Other Ambulatory Visit: Payer: Self-pay | Admitting: Internal Medicine

## 2018-08-02 DIAGNOSIS — Z1231 Encounter for screening mammogram for malignant neoplasm of breast: Secondary | ICD-10-CM

## 2018-09-06 DIAGNOSIS — L03032 Cellulitis of left toe: Secondary | ICD-10-CM | POA: Diagnosis not present

## 2018-09-06 DIAGNOSIS — B351 Tinea unguium: Secondary | ICD-10-CM | POA: Diagnosis not present

## 2018-09-06 DIAGNOSIS — L02612 Cutaneous abscess of left foot: Secondary | ICD-10-CM | POA: Diagnosis not present

## 2018-09-06 DIAGNOSIS — B353 Tinea pedis: Secondary | ICD-10-CM | POA: Diagnosis not present

## 2018-09-06 DIAGNOSIS — M21611 Bunion of right foot: Secondary | ICD-10-CM | POA: Diagnosis not present

## 2018-09-06 DIAGNOSIS — M21612 Bunion of left foot: Secondary | ICD-10-CM | POA: Diagnosis not present

## 2018-09-20 DIAGNOSIS — L03032 Cellulitis of left toe: Secondary | ICD-10-CM | POA: Diagnosis not present

## 2018-09-21 DIAGNOSIS — B351 Tinea unguium: Secondary | ICD-10-CM | POA: Diagnosis not present

## 2018-09-28 ENCOUNTER — Ambulatory Visit: Payer: BLUE CROSS/BLUE SHIELD

## 2018-10-03 ENCOUNTER — Ambulatory Visit
Admission: RE | Admit: 2018-10-03 | Discharge: 2018-10-03 | Disposition: A | Discharge status: Home / Self Care | Payer: BC Managed Care – PPO | Source: Ambulatory Visit | Attending: Internal Medicine | Admitting: Internal Medicine

## 2018-10-03 DIAGNOSIS — Z1231 Encounter for screening mammogram for malignant neoplasm of breast: Secondary | ICD-10-CM

## 2018-10-04 DIAGNOSIS — E119 Type 2 diabetes mellitus without complications: Secondary | ICD-10-CM | POA: Diagnosis not present

## 2018-10-04 DIAGNOSIS — L03032 Cellulitis of left toe: Secondary | ICD-10-CM | POA: Diagnosis not present

## 2018-10-04 DIAGNOSIS — B351 Tinea unguium: Secondary | ICD-10-CM | POA: Diagnosis not present

## 2018-10-04 DIAGNOSIS — B353 Tinea pedis: Secondary | ICD-10-CM | POA: Diagnosis not present

## 2018-11-27 DIAGNOSIS — E119 Type 2 diabetes mellitus without complications: Secondary | ICD-10-CM | POA: Diagnosis not present

## 2018-11-27 DIAGNOSIS — Z23 Encounter for immunization: Secondary | ICD-10-CM | POA: Diagnosis not present

## 2018-11-30 DIAGNOSIS — L03032 Cellulitis of left toe: Secondary | ICD-10-CM | POA: Diagnosis not present

## 2018-11-30 DIAGNOSIS — E119 Type 2 diabetes mellitus without complications: Secondary | ICD-10-CM | POA: Diagnosis not present

## 2018-11-30 DIAGNOSIS — E785 Hyperlipidemia, unspecified: Secondary | ICD-10-CM | POA: Diagnosis not present

## 2018-12-19 DIAGNOSIS — D485 Neoplasm of uncertain behavior of skin: Secondary | ICD-10-CM | POA: Diagnosis not present

## 2018-12-19 DIAGNOSIS — L720 Epidermal cyst: Secondary | ICD-10-CM | POA: Diagnosis not present

## 2018-12-28 ENCOUNTER — Other Ambulatory Visit: Payer: Self-pay

## 2019-01-01 ENCOUNTER — Other Ambulatory Visit (HOSPITAL_COMMUNITY)
Admission: RE | Admit: 2019-01-01 | Discharge: 2019-01-01 | Disposition: A | Discharge status: Home / Self Care | Payer: BC Managed Care – PPO | Source: Ambulatory Visit | Attending: Certified Nurse Midwife | Admitting: Certified Nurse Midwife

## 2019-01-01 ENCOUNTER — Ambulatory Visit: Payer: BC Managed Care – PPO | Admitting: Certified Nurse Midwife

## 2019-01-01 ENCOUNTER — Other Ambulatory Visit: Payer: Self-pay

## 2019-01-01 ENCOUNTER — Encounter: Payer: Self-pay | Admitting: Certified Nurse Midwife

## 2019-01-01 VITALS — BP 110/70 | HR 70 | Temp 97.1°F | Resp 16 | Ht 65.25 in | Wt 128.0 lb

## 2019-01-01 DIAGNOSIS — Z780 Asymptomatic menopausal state: Secondary | ICD-10-CM | POA: Diagnosis not present

## 2019-01-01 DIAGNOSIS — Z124 Encounter for screening for malignant neoplasm of cervix: Secondary | ICD-10-CM

## 2019-01-01 DIAGNOSIS — Z01419 Encounter for gynecological examination (general) (routine) without abnormal findings: Secondary | ICD-10-CM | POA: Diagnosis not present

## 2019-01-01 NOTE — Patient Instructions (Signed)

## 2019-01-01 NOTE — Progress Notes (Signed)
58 y.o. [id number] Married  Caucasian Fe here for annual exam.Menopausal no HRT. Denies vaginal bleeding or dryness. Diabetes good control with insulin Dexcom and level is 6.2. Dr. Radene Gunning manages cholesterol with  Zocor, all stable. No HSV outbreak and no Valtrex use in the last year. Stepdaughter is getting married in 02/2019.  Patient's last menstrual period was 10/10/2007.          Sexually active: Yes.    The current method of family planning is tubal ligation.    Exercising: Yes.    walking & tennis Smoker:  no  Review of Systems  Constitutional: Negative.   HENT: Negative.   Eyes: Negative.   Respiratory: Negative.   Cardiovascular: Negative.   Gastrointestinal: Negative.   Genitourinary: Negative.   Musculoskeletal: Negative.   Skin: Negative.   Neurological: Negative.   Endo/Heme/Allergies: Negative.   Psychiatric/Behavioral: Negative.     Health Maintenance: Pap:  12-24-15 neg HPV HR neg History of Abnormal Pap: yes MMG:  10-03-2018 category c density birads 1:neg Self Breast exams: yes Colonoscopy:  2014 neg f/u 51yrs BMD:   2019 pcp manages TDaP:  2018 Shingles: not done Pneumonia: not done Hep C and HIV: both neg yrs ago Labs: PCP   reports that she has never smoked. She has never used smokeless tobacco. She reports current alcohol use of about 5.0 standard drinks of alcohol per week. She reports that she does not use drugs.  Past Medical History:  Diagnosis Date  . Abnormal Pap smear of cervix    over 20 yrs ago  . Diabetes mellitus without complication ([place])    type 1  . Hyperlipidemia   . STD (sexually transmitted disease)    HSV1    Past Surgical History:  Procedure Laterality Date  . BREAST BIOPSY Left 09/16/1999  . CRYOTHERAPY     over 64yrs ago  . [place]   left  . [place]  . TUBAL LIGATION  1999    Current Outpatient Medications  Medication Sig Dispense Refill  . BD PEN NEEDLE NANO  2ND GEN 32G X 4 MM MISC U TID TO ADMINISTER INSULIN    . Continuous Blood Gluc Sensor (DEXCOM G6 SENSOR) MISC CHANGE Q 10 DAYS TO MONITOR BG ELECTRONICALLY    . Continuous Blood Gluc Transmit (DEXCOM G6 TRANSMITTER) MISC REPLACE Q 3 MONTHS TO MONITOR BG CONTINUOUSLY    . Insulin Degludec (TRESIBA FLEXTOUCH) 100 UNIT/ML SOPN Inject into the skin.    Marland Kitchen NOVOLOG FLEXPEN 100 UNIT/ML FlexPen INJECT 5-15 UNITS SUBCUTANEOUSLY THREE TIMES DAILY AS DIRECTED BEFORE EVERY MEAL  12  . ONE TOUCH ULTRA TEST test strip USE ONE STRIP PER TEST 5 TIMES DAILY OR AS DIRECTED  6  . simvastatin (ZOCOR) 40 MG tablet Take 40 mg by mouth daily.  4  . valACYclovir (VALTREX) 500 MG tablet Take one tablet bid for 3 days prn 30 tablet 2  . ALPRAZolam (XANAX) 0.25 MG tablet TK 1 T PO Q 6 H PRN     No current facility-administered medications for this visit.     Family History  Problem Relation Age of Onset  . Hypertension Father   . Cancer Father        prostate  . Parkinson's disease Father   . Breast cancer Maternal Aunt 60  . Breast cancer Paternal Aunt 30  . Diabetes Maternal Grandfather   . Osteoarthritis Mother     ROS:  Pertinent  items are noted in HPI.  Otherwise, a comprehensive ROS was negative.  Exam:   BP 110/70   Pulse 70   Temp (!) 97.1 F (36.2 C) (Skin)   Resp 16   Ht 5' 5.25" (1.657 m)   Wt 128 lb (58.1 kg)   LMP 10/10/2007   BMI 21.14 kg/m  Height: 5' 5.25" (165.7 cm) Ht Readings from Last 3 Encounters:  01/01/19 5' 5.25" (1.657 m)  12/28/17 5' 5.25" (1.657 m)  12/27/16 5' 5.5" (1.664 m)    General appearance: alert, cooperative and appears stated age Head: Normocephalic, without obvious abnormality, atraumatic Neck: no adenopathy, supple, symmetrical, trachea midline and thyroid normal to inspection and palpation Lungs: clear to auscultation bilaterally Breasts: normal appearance, no masses or tenderness, No nipple retraction or dimpling, No nipple discharge or bleeding, No  axillary or supraclavicular adenopathy Heart: regular rate and rhythm Abdomen: soft, non-tender; no masses,  no organomegaly Extremities: extremities normal, atraumatic, no cyanosis or edema Skin: Skin color, texture, turgor normal. No rashes or lesions Lymph nodes: Cervical, supraclavicular, and axillary nodes normal. No abnormal inguinal nodes palpated Neurologic: Grossly normal   Pelvic: External genitalia:  no lesions              Urethra:  normal appearing urethra with no masses, tenderness or lesions              Bartholin's and Skene's: normal                 Vagina: normal appearing vagina with normal color and discharge, no lesions              Cervix: no cervical motion tenderness, no lesions and normal appearance              Pap taken: Yes.   Bimanual Exam:  Uterus:  normal size, contour, position, consistency, mobility, non-tender and anteverted              Adnexa: normal adnexa and no mass, fullness, tenderness               Rectovaginal: Confirms               Anus:  normal sphincter tone, no lesions  Chaperone present: yes  A:  Well Woman with normal exam  Menopausal no HRT  Glucose/cholesterol management with PCP stable  Weight loss not intentional, managing with PCP.  P:   Reviewed health and wellness pertinent to exam.  Aware of need to advise if vaginal bleeding.  Continue follow up with PCP as indicated  Pap smear: yes   counseled on breast self exam, mammography screening, feminine hygiene, adequate intake of calcium and vitamin D, diet and exercise  return annually or prn  An After Visit Summary was printed and given to the patient.

## 2019-01-02 LAB — CYTOLOGY - PAP: Diagnosis: NEGATIVE

## 2019-03-25 DIAGNOSIS — E119 Type 2 diabetes mellitus without complications: Secondary | ICD-10-CM | POA: Diagnosis not present

## 2019-03-25 DIAGNOSIS — B351 Tinea unguium: Secondary | ICD-10-CM | POA: Diagnosis not present

## 2019-03-25 DIAGNOSIS — B353 Tinea pedis: Secondary | ICD-10-CM | POA: Diagnosis not present

## 2019-03-25 DIAGNOSIS — L602 Onychogryphosis: Secondary | ICD-10-CM | POA: Diagnosis not present

## 2019-04-10 DIAGNOSIS — E119 Type 2 diabetes mellitus without complications: Secondary | ICD-10-CM | POA: Diagnosis not present

## 2019-04-15 DIAGNOSIS — M1990 Unspecified osteoarthritis, unspecified site: Secondary | ICD-10-CM | POA: Insufficient documentation

## 2019-04-15 DIAGNOSIS — E119 Type 2 diabetes mellitus without complications: Secondary | ICD-10-CM | POA: Diagnosis not present

## 2019-04-15 DIAGNOSIS — E785 Hyperlipidemia, unspecified: Secondary | ICD-10-CM | POA: Diagnosis not present

## 2019-04-15 DIAGNOSIS — L03032 Cellulitis of left toe: Secondary | ICD-10-CM | POA: Diagnosis not present

## 2019-06-06 DIAGNOSIS — M79644 Pain in right finger(s): Secondary | ICD-10-CM | POA: Diagnosis not present

## 2019-06-25 DIAGNOSIS — B351 Tinea unguium: Secondary | ICD-10-CM | POA: Diagnosis not present

## 2019-06-25 DIAGNOSIS — L602 Onychogryphosis: Secondary | ICD-10-CM | POA: Diagnosis not present

## 2019-06-25 DIAGNOSIS — E119 Type 2 diabetes mellitus without complications: Secondary | ICD-10-CM | POA: Diagnosis not present

## 2019-06-25 DIAGNOSIS — B353 Tinea pedis: Secondary | ICD-10-CM | POA: Diagnosis not present

## 2019-07-02 ENCOUNTER — Encounter: Payer: Self-pay | Admitting: Certified Nurse Midwife

## 2019-07-04 DIAGNOSIS — E119 Type 2 diabetes mellitus without complications: Secondary | ICD-10-CM | POA: Diagnosis not present

## 2019-07-04 DIAGNOSIS — H04123 Dry eye syndrome of bilateral lacrimal glands: Secondary | ICD-10-CM | POA: Diagnosis not present

## 2019-07-04 DIAGNOSIS — H2513 Age-related nuclear cataract, bilateral: Secondary | ICD-10-CM | POA: Diagnosis not present

## 2019-07-04 DIAGNOSIS — H1045 Other chronic allergic conjunctivitis: Secondary | ICD-10-CM | POA: Diagnosis not present

## 2019-07-15 DIAGNOSIS — E119 Type 2 diabetes mellitus without complications: Secondary | ICD-10-CM | POA: Diagnosis not present

## 2019-07-15 DIAGNOSIS — E7849 Other hyperlipidemia: Secondary | ICD-10-CM | POA: Diagnosis not present

## 2019-07-15 DIAGNOSIS — Z0000 Encounter for general adult medical examination without abnormal findings: Secondary | ICD-10-CM | POA: Diagnosis not present

## 2019-07-22 DIAGNOSIS — E785 Hyperlipidemia, unspecified: Secondary | ICD-10-CM | POA: Diagnosis not present

## 2019-07-22 DIAGNOSIS — M8580 Other specified disorders of bone density and structure, unspecified site: Secondary | ICD-10-CM | POA: Diagnosis not present

## 2019-07-22 DIAGNOSIS — E162 Hypoglycemia, unspecified: Secondary | ICD-10-CM | POA: Diagnosis not present

## 2019-07-22 DIAGNOSIS — F4320 Adjustment disorder, unspecified: Secondary | ICD-10-CM | POA: Insufficient documentation

## 2019-07-22 DIAGNOSIS — E7849 Other hyperlipidemia: Secondary | ICD-10-CM | POA: Diagnosis not present

## 2019-07-22 DIAGNOSIS — R82998 Other abnormal findings in urine: Secondary | ICD-10-CM | POA: Diagnosis not present

## 2019-07-22 DIAGNOSIS — E119 Type 2 diabetes mellitus without complications: Secondary | ICD-10-CM | POA: Diagnosis not present

## 2019-07-22 DIAGNOSIS — Z0000 Encounter for general adult medical examination without abnormal findings: Secondary | ICD-10-CM | POA: Diagnosis not present

## 2019-07-22 DIAGNOSIS — Z1331 Encounter for screening for depression: Secondary | ICD-10-CM | POA: Diagnosis not present

## 2019-07-23 DIAGNOSIS — Z1212 Encounter for screening for malignant neoplasm of rectum: Secondary | ICD-10-CM | POA: Diagnosis not present

## 2019-07-23 DIAGNOSIS — F439 Reaction to severe stress, unspecified: Secondary | ICD-10-CM | POA: Diagnosis not present

## 2019-08-23 ENCOUNTER — Other Ambulatory Visit: Payer: Self-pay | Admitting: Internal Medicine

## 2019-08-23 DIAGNOSIS — Z1231 Encounter for screening mammogram for malignant neoplasm of breast: Secondary | ICD-10-CM

## 2019-08-27 DIAGNOSIS — F439 Reaction to severe stress, unspecified: Secondary | ICD-10-CM | POA: Diagnosis not present

## 2019-09-27 DIAGNOSIS — I788 Other diseases of capillaries: Secondary | ICD-10-CM | POA: Diagnosis not present

## 2019-09-27 DIAGNOSIS — L700 Acne vulgaris: Secondary | ICD-10-CM | POA: Diagnosis not present

## 2019-09-27 DIAGNOSIS — C44719 Basal cell carcinoma of skin of left lower limb, including hip: Secondary | ICD-10-CM | POA: Diagnosis not present

## 2019-10-15 ENCOUNTER — Other Ambulatory Visit: Payer: Self-pay

## 2019-10-15 ENCOUNTER — Ambulatory Visit
Admission: RE | Admit: 2019-10-15 | Discharge: 2019-10-15 | Disposition: A | Discharge status: Home / Self Care | Payer: BC Managed Care – PPO | Source: Ambulatory Visit | Attending: Internal Medicine | Admitting: Internal Medicine

## 2019-10-15 DIAGNOSIS — Z1231 Encounter for screening mammogram for malignant neoplasm of breast: Secondary | ICD-10-CM

## 2019-10-16 DIAGNOSIS — C44719 Basal cell carcinoma of skin of left lower limb, including hip: Secondary | ICD-10-CM | POA: Diagnosis not present

## 2019-10-31 DIAGNOSIS — W540XXA Bitten by dog, initial encounter: Secondary | ICD-10-CM | POA: Diagnosis not present

## 2019-10-31 DIAGNOSIS — S0190XA Unspecified open wound of unspecified part of head, initial encounter: Secondary | ICD-10-CM | POA: Diagnosis not present

## 2019-10-31 DIAGNOSIS — S61409A Unspecified open wound of unspecified hand, initial encounter: Secondary | ICD-10-CM | POA: Diagnosis not present

## 2019-12-02 DIAGNOSIS — E119 Type 2 diabetes mellitus without complications: Secondary | ICD-10-CM | POA: Diagnosis not present

## 2019-12-02 DIAGNOSIS — B351 Tinea unguium: Secondary | ICD-10-CM | POA: Diagnosis not present

## 2019-12-02 DIAGNOSIS — L602 Onychogryphosis: Secondary | ICD-10-CM | POA: Diagnosis not present

## 2019-12-02 DIAGNOSIS — B353 Tinea pedis: Secondary | ICD-10-CM | POA: Diagnosis not present

## 2019-12-09 DIAGNOSIS — E119 Type 2 diabetes mellitus without complications: Secondary | ICD-10-CM | POA: Diagnosis not present

## 2019-12-13 DIAGNOSIS — E119 Type 2 diabetes mellitus without complications: Secondary | ICD-10-CM | POA: Diagnosis not present

## 2019-12-19 DIAGNOSIS — B353 Tinea pedis: Secondary | ICD-10-CM | POA: Diagnosis not present

## 2019-12-19 DIAGNOSIS — E119 Type 2 diabetes mellitus without complications: Secondary | ICD-10-CM | POA: Diagnosis not present

## 2019-12-19 DIAGNOSIS — B351 Tinea unguium: Secondary | ICD-10-CM | POA: Diagnosis not present

## 2019-12-19 DIAGNOSIS — L602 Onychogryphosis: Secondary | ICD-10-CM | POA: Diagnosis not present

## 2020-01-01 DIAGNOSIS — B353 Tinea pedis: Secondary | ICD-10-CM | POA: Diagnosis not present

## 2020-01-01 DIAGNOSIS — E119 Type 2 diabetes mellitus without complications: Secondary | ICD-10-CM | POA: Diagnosis not present

## 2020-01-01 DIAGNOSIS — L602 Onychogryphosis: Secondary | ICD-10-CM | POA: Diagnosis not present

## 2020-01-01 DIAGNOSIS — B351 Tinea unguium: Secondary | ICD-10-CM | POA: Diagnosis not present

## 2020-01-01 NOTE — Progress Notes (Signed)
59 y.o. [id number] Married White or Caucasian Not Hispanic or Latino female here for annual exam.  No vaginal bleeding. No dyspareunia.   H/O DM (not quite type 1, not quite type 2), diagnosed at 52. She had gestational DM with both pregnancy. Last HgbA1C was 5.7 a few weeks ago.     Patient's last menstrual period was 10/10/2007.          Sexually active: Yes.    The current method of family planning is post menopausal status.    Exercising: Yes.    Tennis, Walking , Golfing  Smoker:  no  Health Maintenance: Pap:  01/01/19 negative (no HPV testing). 12-24-15 neg HPV HR neg  History of abnormal Pap:  Yes, she had cryosurgery at least 25 years.  MMG:  10/16/19 Density C Bi-rads 1 neg  BMD:   None  Colonoscopy: 06/01/12 f/u 10 years  TDaP:  09/09/16  Gardasil: NA   reports that she has never smoked. She has never used smokeless tobacco. She reports current alcohol use of about 5.0 standard drinks of alcohol per week. She reports that she does not use drugs. 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 Daughters are 61 and almost 3. Older one is working in [place]. Younger daughter is getting her masters in Occupational therapy. 2 stepchildren, oldest is 93 and he is autistic.  Past Medical History:  Diagnosis Date  . Abnormal Pap smear of cervix    over 20 yrs ago  . Diabetes mellitus without complication (HCC)    type 1  . Hyperlipidemia   . STD (sexually transmitted disease)    HSV1    Past Surgical History:  Procedure Laterality Date  . BREAST BIOPSY Left 09/16/1999  . CRYOTHERAPY     over 34yrs ago  . INGUINAL HERNIA REPAIR  1999   left  . LAPAROSCOPY FOR ECTOPIC PREGNANCY  1993 & 1992  . TUBAL LIGATION  1999    Current Outpatient Medications  Medication Sig Dispense Refill  . ALPRAZolam (XANAX) 0.25 MG tablet TK 1 T PO Q 6 H PRN    . BD PEN NEEDLE NANO 2ND GEN 32G X 4 MM MISC U TID TO ADMINISTER INSULIN    . Continuous Blood Gluc Sensor (DEXCOM G6 SENSOR) MISC  CHANGE Q 10 DAYS TO MONITOR BG ELECTRONICALLY    . Continuous Blood Gluc Transmit (DEXCOM G6 TRANSMITTER) MISC REPLACE Q 3 MONTHS TO MONITOR BG CONTINUOUSLY    . Insulin Degludec (TRESIBA FLEXTOUCH) 100 UNIT/ML SOPN Inject into the skin.    Marland Kitchen NOVOLOG FLEXPEN 100 UNIT/ML FlexPen INJECT 5-15 UNITS SUBCUTANEOUSLY THREE TIMES DAILY AS DIRECTED BEFORE EVERY MEAL  12  . ONE TOUCH ULTRA TEST test strip USE ONE STRIP PER TEST 5 TIMES DAILY OR AS DIRECTED  6  . simvastatin (ZOCOR) 40 MG tablet Take 40 mg by mouth daily.  4  . valACYclovir (VALTREX) 500 MG tablet Take one tablet bid for 3 days prn 30 tablet 2   No current facility-administered medications for this visit.    Family History  Problem Relation Age of Onset  . Hypertension Father   . Cancer Father        prostate  . Parkinson's disease Father   . Breast cancer Maternal Aunt 60  . Breast cancer Paternal Aunt 77  . Diabetes Maternal Grandfather   . Osteoarthritis Mother     Review of Systems  All other systems reviewed and are negative.   Exam:   BP  132/78   Pulse 89   Ht 5' 5.25" (1.657 m)   Wt 125 lb (56.7 kg)   LMP 10/10/2007   SpO2 98%   BMI 20.64 kg/m   Weight change: @WEIGHTCHANGE @ Height:   Height: 5' 5.25" (165.7 cm)  Ht Readings from Last 3 Encounters:  01/02/20 5' 5.25" (1.657 m)  01/01/19 5' 5.25" (1.657 m)  12/28/17 5' 5.25" (1.657 m)    General appearance: alert, cooperative and appears stated age Head: Normocephalic, without obvious abnormality, atraumatic Neck: no adenopathy, supple, symmetrical, trachea midline and thyroid normal to inspection and palpation Lungs: clear to auscultation bilaterally Cardiovascular: regular rate and rhythm Breasts: normal appearance, no masses or tenderness Abdomen: soft, non-tender; non distended,  no masses,  no organomegaly Extremities: extremities normal, atraumatic, no cyanosis or edema Skin: Skin color, texture, turgor normal. No rashes or lesions Lymph nodes:  Cervical, supraclavicular, and axillary nodes normal. No abnormal inguinal nodes palpated Neurologic: Grossly normal   Pelvic: External genitalia:  no lesions              Urethra:  normal appearing urethra with no masses, tenderness or lesions              Bartholins and Skenes: normal                 Vagina: atrophic appearing vagina with normal color and discharge, no lesions              Cervix: no lesions               Bimanual Exam:  Uterus:  normal size, contour, position, consistency, mobility, non-tender              Adnexa: no mass, fullness, tenderness               Rectovaginal: Confirms               Anus:  normal sphincter tone, no lesions  12/30/17 chaperoned for the exam.  A:  Well Woman with normal exam  P:   No pap this year  Mammogram UTD  Colonoscopy UTD  Discussed breast self exam  Discussed calcium and vit D intake  Labs with primary

## 2020-01-02 ENCOUNTER — Other Ambulatory Visit: Payer: Self-pay

## 2020-01-02 ENCOUNTER — Encounter: Payer: Self-pay | Admitting: Obstetrics and Gynecology

## 2020-01-02 ENCOUNTER — Ambulatory Visit: Payer: BC Managed Care – PPO | Admitting: Obstetrics and Gynecology

## 2020-01-02 VITALS — BP 132/78 | HR 89 | Ht 65.25 in | Wt 125.0 lb

## 2020-01-02 DIAGNOSIS — Z8639 Personal history of other endocrine, nutritional and metabolic disease: Secondary | ICD-10-CM | POA: Diagnosis not present

## 2020-01-02 DIAGNOSIS — Z01419 Encounter for gynecological examination (general) (routine) without abnormal findings: Secondary | ICD-10-CM | POA: Diagnosis not present

## 2020-01-02 DIAGNOSIS — E559 Vitamin D deficiency, unspecified: Secondary | ICD-10-CM | POA: Diagnosis not present

## 2020-01-02 NOTE — Patient Instructions (Signed)
EXERCISE AND DIET:  We recommended that you start or continue a regular exercise program for good health. Regular exercise means any activity that makes your heart beat faster and makes you sweat.  We recommend exercising at least 30 minutes per day at least 3 days a week, preferably 4 or 5.  We also recommend a diet low in fat and sugar.  Inactivity, poor dietary choices and obesity can cause diabetes, heart attack, stroke, and kidney damage, among others.    ALCOHOL AND SMOKING:  Women should limit their alcohol intake to no more than 7 drinks/beers/glasses of wine (combined, not each!) per week. Moderation of alcohol intake to this level decreases your risk of breast cancer and liver damage. And of course, no recreational drugs are part of a healthy lifestyle.  And absolutely no smoking or even second hand smoke. Most people know smoking can cause heart and lung diseases, but did you know it also contributes to weakening of your bones? Aging of your skin?  Yellowing of your teeth and nails?  CALCIUM AND VITAMIN D:  Adequate intake of calcium and Vitamin D are recommended.  The recommendations for exact amounts of these supplements seem to change often, but generally speaking 1,000 mg of calcium (between diet and supplement) and 800 units of Vitamin D per day seems prudent. Certain women may benefit from higher intake of Vitamin D.  If you are among these women, your doctor will have told you during your visit.    PAP SMEARS:  Pap smears, to check for cervical cancer or precancers,  have traditionally been done yearly, although recent scientific advances have shown that most women can have pap smears less often.  However, every woman still should have a physical exam from her gynecologist every year. It will include a breast check, inspection of the vulva and vagina to check for abnormal growths or skin changes, a visual exam of the cervix, and then an exam to evaluate the size and shape of the uterus and  ovaries.  And after 60 years of age, a rectal exam is indicated to check for rectal cancers. We will also provide age appropriate advice regarding health maintenance, like when you should have certain vaccines, screening for sexually transmitted diseases, bone density testing, colonoscopy, mammograms, etc.   MAMMOGRAMS:  All women over 40 years old should have a yearly mammogram. Many facilities now offer a "3D" mammogram, which may cost around $50 extra out of pocket. If possible,  we recommend you accept the option to have the 3D mammogram performed.  It both reduces the number of women who will be called back for extra views which then turn out to be normal, and it is better than the routine mammogram at detecting truly abnormal areas.    COLON CANCER SCREENING: Now recommend starting at age 45. At this time colonoscopy is not covered for routine screening until 50. There are take home tests that can be done between 45-49.   COLONOSCOPY:  Colonoscopy to screen for colon cancer is recommended for all women at age 50.  We know, you hate the idea of the prep.  We agree, BUT, having colon cancer and not knowing it is worse!!  Colon cancer so often starts as a polyp that can be seen and removed at colonscopy, which can quite literally save your life!  And if your first colonoscopy is normal and you have no family history of colon cancer, most women don't have to have it again for   10 years.  Once every ten years, you can do something that may end up saving your life, right?  We will be happy to help you get it scheduled when you are ready.  Be sure to check your insurance coverage so you understand how much it will cost.  It may be covered as a preventative service at no cost, but you should check your particular policy.      Breast Self-Awareness Breast self-awareness means being familiar with how your breasts look and feel. It involves checking your breasts regularly and reporting any changes to your  health care provider. Practicing breast self-awareness is important. A change in your breasts can be a sign of a serious medical problem. Being familiar with how your breasts look and feel allows you to find any problems early, when treatment is more likely to be successful. All women should practice breast self-awareness, including women who have had breast implants. How to do a breast self-exam One way to learn what is normal for your breasts and whether your breasts are changing is to do a breast self-exam. To do a breast self-exam: Look for Changes  1. Remove all the clothing above your waist. 2. Stand in front of a mirror in a room with good lighting. 3. Put your hands on your hips. 4. Push your hands firmly downward. 5. Compare your breasts in the mirror. Look for differences between them (asymmetry), such as: ? Differences in shape. ? Differences in size. ? Puckers, dips, and bumps in one breast and not the other. 6. Look at each breast for changes in your skin, such as: ? Redness. ? Scaly areas. 7. Look for changes in your nipples, such as: ? Discharge. ? Bleeding. ? Dimpling. ? Redness. ? A change in position. Feel for Changes Carefully feel your breasts for lumps and changes. It is best to do this while lying on your back on the floor and again while sitting or standing in the shower or tub with soapy water on your skin. Feel each breast in the following way:  Place the arm on the side of the breast you are examining above your head.  Feel your breast with the other hand.  Start in the nipple area and make  inch (2 cm) overlapping circles to feel your breast. Use the pads of your three middle fingers to do this. Apply light pressure, then medium pressure, then firm pressure. The light pressure will allow you to feel the tissue closest to the skin. The medium pressure will allow you to feel the tissue that is a little deeper. The firm pressure will allow you to feel the tissue  close to the ribs.  Continue the overlapping circles, moving downward over the breast until you feel your ribs below your breast.  Move one finger-width toward the center of the body. Continue to use the  inch (2 cm) overlapping circles to feel your breast as you move slowly up toward your collarbone.  Continue the up and down exam using all three pressures until you reach your armpit.  Write Down What You Find  Write down what is normal for each breast and any changes that you find. Keep a written record with breast changes or normal findings for each breast. By writing this information down, you do not need to depend only on memory for size, tenderness, or location. Write down where you are in your menstrual cycle, if you are still menstruating. If you are having trouble noticing differences   in your breasts, do not get discouraged. With time you will become more familiar with the variations in your breasts and more comfortable with the exam. How often should I examine my breasts? Examine your breasts every month. If you are breastfeeding, the best time to examine your breasts is after a feeding or after using a breast pump. If you menstruate, the best time to examine your breasts is 5-7 days after your period is over. During your period, your breasts are lumpier, and it may be more difficult to notice changes. When should I see my health care provider? See your health care provider if you notice:  A change in shape or size of your breasts or nipples.  A change in the skin of your breast or nipples, such as a reddened or scaly area.  Unusual discharge from your nipples.  A lump or thick area that was not there before.  Pain in your breasts.  Anything that concerns you.  

## 2020-01-03 ENCOUNTER — Ambulatory Visit: Payer: BC Managed Care – PPO | Admitting: Certified Nurse Midwife

## 2020-01-22 DIAGNOSIS — B351 Tinea unguium: Secondary | ICD-10-CM | POA: Diagnosis not present

## 2020-01-22 DIAGNOSIS — B353 Tinea pedis: Secondary | ICD-10-CM | POA: Diagnosis not present

## 2020-01-22 DIAGNOSIS — E119 Type 2 diabetes mellitus without complications: Secondary | ICD-10-CM | POA: Diagnosis not present

## 2020-01-22 DIAGNOSIS — L602 Onychogryphosis: Secondary | ICD-10-CM | POA: Diagnosis not present

## 2020-01-29 DIAGNOSIS — Z23 Encounter for immunization: Secondary | ICD-10-CM | POA: Diagnosis not present

## 2020-02-12 DIAGNOSIS — L602 Onychogryphosis: Secondary | ICD-10-CM | POA: Diagnosis not present

## 2020-02-12 DIAGNOSIS — B353 Tinea pedis: Secondary | ICD-10-CM | POA: Diagnosis not present

## 2020-02-12 DIAGNOSIS — B351 Tinea unguium: Secondary | ICD-10-CM | POA: Diagnosis not present

## 2020-02-12 DIAGNOSIS — E119 Type 2 diabetes mellitus without complications: Secondary | ICD-10-CM | POA: Diagnosis not present

## 2020-04-15 DIAGNOSIS — D2262 Melanocytic nevi of left upper limb, including shoulder: Secondary | ICD-10-CM | POA: Diagnosis not present

## 2020-04-15 DIAGNOSIS — D2261 Melanocytic nevi of right upper limb, including shoulder: Secondary | ICD-10-CM | POA: Diagnosis not present

## 2020-04-15 DIAGNOSIS — L570 Actinic keratosis: Secondary | ICD-10-CM | POA: Diagnosis not present

## 2020-04-15 DIAGNOSIS — D225 Melanocytic nevi of trunk: Secondary | ICD-10-CM | POA: Diagnosis not present

## 2020-04-15 DIAGNOSIS — Z85828 Personal history of other malignant neoplasm of skin: Secondary | ICD-10-CM | POA: Diagnosis not present

## 2020-04-21 DIAGNOSIS — E119 Type 2 diabetes mellitus without complications: Secondary | ICD-10-CM | POA: Diagnosis not present

## 2020-04-24 DIAGNOSIS — E119 Type 2 diabetes mellitus without complications: Secondary | ICD-10-CM | POA: Diagnosis not present

## 2020-07-06 DIAGNOSIS — H2513 Age-related nuclear cataract, bilateral: Secondary | ICD-10-CM | POA: Diagnosis not present

## 2020-07-06 DIAGNOSIS — H16223 Keratoconjunctivitis sicca, not specified as Sjogren's, bilateral: Secondary | ICD-10-CM | POA: Diagnosis not present

## 2020-07-06 DIAGNOSIS — Z794 Long term (current) use of insulin: Secondary | ICD-10-CM | POA: Diagnosis not present

## 2020-07-06 DIAGNOSIS — E119 Type 2 diabetes mellitus without complications: Secondary | ICD-10-CM | POA: Diagnosis not present

## 2020-08-11 DIAGNOSIS — E119 Type 2 diabetes mellitus without complications: Secondary | ICD-10-CM | POA: Diagnosis not present

## 2020-08-11 DIAGNOSIS — E785 Hyperlipidemia, unspecified: Secondary | ICD-10-CM | POA: Diagnosis not present

## 2020-08-11 DIAGNOSIS — M859 Disorder of bone density and structure, unspecified: Secondary | ICD-10-CM | POA: Diagnosis not present

## 2020-08-18 DIAGNOSIS — E119 Type 2 diabetes mellitus without complications: Secondary | ICD-10-CM | POA: Diagnosis not present

## 2020-08-18 DIAGNOSIS — Z1212 Encounter for screening for malignant neoplasm of rectum: Secondary | ICD-10-CM | POA: Diagnosis not present

## 2020-08-18 DIAGNOSIS — Z0000 Encounter for general adult medical examination without abnormal findings: Secondary | ICD-10-CM | POA: Diagnosis not present

## 2020-09-17 ENCOUNTER — Other Ambulatory Visit: Payer: Self-pay | Admitting: Internal Medicine

## 2020-09-17 DIAGNOSIS — Z1231 Encounter for screening mammogram for malignant neoplasm of breast: Secondary | ICD-10-CM

## 2020-11-16 ENCOUNTER — Ambulatory Visit: Payer: BC Managed Care – PPO

## 2020-12-21 DIAGNOSIS — M8580 Other specified disorders of bone density and structure, unspecified site: Secondary | ICD-10-CM | POA: Diagnosis not present

## 2020-12-21 DIAGNOSIS — Z780 Asymptomatic menopausal state: Secondary | ICD-10-CM | POA: Diagnosis not present

## 2020-12-21 DIAGNOSIS — E119 Type 2 diabetes mellitus without complications: Secondary | ICD-10-CM | POA: Diagnosis not present

## 2021-01-07 ENCOUNTER — Encounter: Payer: Self-pay | Admitting: Obstetrics and Gynecology

## 2021-01-07 ENCOUNTER — Ambulatory Visit (INDEPENDENT_AMBULATORY_CARE_PROVIDER_SITE_OTHER): Payer: BC Managed Care – PPO | Admitting: Obstetrics and Gynecology

## 2021-01-07 ENCOUNTER — Other Ambulatory Visit: Payer: Self-pay

## 2021-01-07 VITALS — BP 130/80 | HR 66 | Ht 65.0 in | Wt 125.0 lb

## 2021-01-07 DIAGNOSIS — Z01419 Encounter for gynecological examination (general) (routine) without abnormal findings: Secondary | ICD-10-CM | POA: Diagnosis not present

## 2021-01-07 NOTE — Patient Instructions (Signed)

## 2021-01-07 NOTE — Progress Notes (Signed)
60 y.o. [id number] Married White or Caucasian Not Hispanic or Latino female here for annual exam.  No vaginal bleeding. No dyspareunia.    H/O DM (not quite type 1, not quite type 2), diagnosed at 28. She had gestational DM with both pregnancy. Last HgbA1C was 5.7%.  Patient's last menstrual period was 10/10/2007.          Sexually active: Yes.    The current method of family planning is post menopausal status.    Exercising: Yes.     Walking yoga tennis pickle ball  Smoker:  no  Health Maintenance: Pap:   01/01/19 negative (no HPV testing). 12-24-15 neg HPV HR neg  History of abnormal Pap:  Yes, she had cryosurgery at least 25 years.  MMG:  10/15/19, has an appointment on 01/18/21  BMD:   6/19 pcp manages  Colonoscopy: 2014 f/u 10 years  TDaP:  2018 Gardasil: N/A   reports that she has never smoked. She has never used smokeless tobacco. She reports current alcohol use of about 5.0 standard drinks per week. She reports that she does not use drugs. 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 Daughters are 24 and 73.  Past Medical History:  Diagnosis Date   Abnormal Pap smear of cervix    over 20 yrs ago   Diabetes mellitus without complication (HCC)    type 1   Hyperlipidemia    STD (sexually transmitted disease)    HSV1    Past Surgical History:  Procedure Laterality Date   BREAST BIOPSY Left 09/16/1999   CRYOTHERAPY     over 46yrs ago   INGUINAL HERNIA REPAIR  1999   left   LAPAROSCOPY FOR ECTOPIC PREGNANCY  1993 & 1992   TUBAL LIGATION  1999    Current Outpatient Medications  Medication Sig Dispense Refill   ALPRAZolam (XANAX) 0.25 MG tablet TK 1 T PO Q 6 H PRN     BD PEN NEEDLE NANO 2ND GEN 32G X 4 MM MISC U TID TO ADMINISTER INSULIN     Continuous Blood Gluc Sensor (DEXCOM G6 SENSOR) MISC CHANGE Q 10 DAYS TO MONITOR BG ELECTRONICALLY     Continuous Blood Gluc Transmit (DEXCOM G6 TRANSMITTER) MISC REPLACE Q 3 MONTHS TO MONITOR BG CONTINUOUSLY     insulin  degludec (TRESIBA) 100 UNIT/ML FlexTouch Pen Inject into the skin.     NOVOLOG FLEXPEN 100 UNIT/ML FlexPen INJECT 5-15 UNITS SUBCUTANEOUSLY THREE TIMES DAILY AS DIRECTED BEFORE EVERY MEAL  12   ONE TOUCH ULTRA TEST test strip USE ONE STRIP PER TEST 5 TIMES DAILY OR AS DIRECTED  6   simvastatin (ZOCOR) 40 MG tablet Take 40 mg by mouth daily.  4   valACYclovir (VALTREX) 500 MG tablet Take one tablet bid for 3 days prn 30 tablet 2   No current facility-administered medications for this visit.    Family History  Problem Relation Age of Onset   Hypertension Father    Cancer Father        prostate   Parkinson's disease Father    Breast cancer Maternal Aunt 8   Breast cancer Paternal Aunt 74   Diabetes Maternal Grandfather    Osteoarthritis Mother     Review of Systems  All other systems reviewed and are negative.  Exam:   BP 130/80   Pulse 66   Ht 5\' 5"  (1.651 m)   Wt 125 lb (56.7 kg)   LMP 10/10/2007   SpO2 100%   BMI  20.80 kg/m   Weight change: @WEIGHTCHANGE @ Height:   Height: 5\' 5"  (165.1 cm)  Ht Readings from Last 3 Encounters:  01/07/21 5\' 5"  (1.651 m)  01/02/20 5' 5.25" (1.657 m)  01/01/19 5' 5.25" (1.657 m)    General appearance: alert, cooperative and appears stated age Head: Normocephalic, without obvious abnormality, atraumatic Neck: no adenopathy, supple, symmetrical, trachea midline and thyroid normal to inspection and palpation Lungs: clear to auscultation bilaterally Cardiovascular: regular rate and rhythm Breasts: normal appearance, no masses or tenderness Abdomen: soft, non-tender; non distended,  no masses,  no organomegaly Extremities: extremities normal, atraumatic, no cyanosis or edema Skin: Skin color, texture, turgor normal. No rashes or lesions Lymph nodes: Cervical, supraclavicular, and axillary nodes normal. No abnormal inguinal nodes palpated Neurologic: Grossly normal   Pelvic: External genitalia:  no lesions              Urethra:  normal  appearing urethra with no masses, tenderness or lesions              Bartholins and Skenes: normal                 Vagina: normal appearing vagina with normal color and discharge, no lesions              Cervix: no lesions               Bimanual Exam:  Uterus:  normal size, contour, position, consistency, mobility, non-tender              Adnexa: no mass, fullness, tenderness               Rectovaginal: Confirms               Anus:  normal sphincter tone, no lesions  chaperoned for the exam.  1. Well woman exam Pap next year Mammogram scheduled DEXA with primary Labs UTD Discussed breast self exam Discussed calcium and vit D intake

## 2021-01-18 ENCOUNTER — Other Ambulatory Visit: Payer: Self-pay

## 2021-01-18 ENCOUNTER — Ambulatory Visit
Admission: RE | Admit: 2021-01-18 | Discharge: 2021-01-18 | Disposition: A | Discharge status: Home / Self Care | Payer: BC Managed Care – PPO | Source: Ambulatory Visit | Attending: Internal Medicine | Admitting: Internal Medicine

## 2021-01-18 DIAGNOSIS — Z1231 Encounter for screening mammogram for malignant neoplasm of breast: Secondary | ICD-10-CM

## 2021-01-28 DIAGNOSIS — L299 Pruritus, unspecified: Secondary | ICD-10-CM | POA: Diagnosis not present

## 2021-02-01 DIAGNOSIS — L299 Pruritus, unspecified: Secondary | ICD-10-CM | POA: Diagnosis not present

## 2021-02-01 DIAGNOSIS — H16223 Keratoconjunctivitis sicca, not specified as Sjogren's, bilateral: Secondary | ICD-10-CM | POA: Diagnosis not present

## 2021-02-01 DIAGNOSIS — H2513 Age-related nuclear cataract, bilateral: Secondary | ICD-10-CM | POA: Diagnosis not present

## 2021-02-05 DIAGNOSIS — L299 Pruritus, unspecified: Secondary | ICD-10-CM | POA: Diagnosis not present

## 2021-02-10 DIAGNOSIS — H16223 Keratoconjunctivitis sicca, not specified as Sjogren's, bilateral: Secondary | ICD-10-CM | POA: Diagnosis not present

## 2021-02-10 DIAGNOSIS — H1045 Other chronic allergic conjunctivitis: Secondary | ICD-10-CM | POA: Diagnosis not present

## 2021-02-10 DIAGNOSIS — H2513 Age-related nuclear cataract, bilateral: Secondary | ICD-10-CM | POA: Diagnosis not present

## 2021-02-11 DIAGNOSIS — L299 Pruritus, unspecified: Secondary | ICD-10-CM | POA: Diagnosis not present

## 2021-02-11 DIAGNOSIS — E119 Type 2 diabetes mellitus without complications: Secondary | ICD-10-CM | POA: Diagnosis not present

## 2021-12-20 ENCOUNTER — Other Ambulatory Visit: Payer: Self-pay | Admitting: Internal Medicine

## 2021-12-20 DIAGNOSIS — Z1231 Encounter for screening mammogram for malignant neoplasm of breast: Secondary | ICD-10-CM

## 2022-01-11 ENCOUNTER — Ambulatory Visit: Payer: BC Managed Care – PPO | Admitting: Obstetrics and Gynecology

## 2022-01-25 ENCOUNTER — Ambulatory Visit
Admission: RE | Admit: 2022-01-25 | Discharge: 2022-01-25 | Disposition: A | Discharge status: Home / Self Care | Payer: BC Managed Care – PPO | Source: Ambulatory Visit | Attending: Internal Medicine | Admitting: Internal Medicine

## 2022-01-25 ENCOUNTER — Other Ambulatory Visit (HOSPITAL_COMMUNITY)
Admission: RE | Admit: 2022-01-25 | Discharge: 2022-01-25 | Disposition: A | Discharge status: Home / Self Care | Payer: BC Managed Care – PPO | Source: Ambulatory Visit | Attending: Obstetrics and Gynecology | Admitting: Obstetrics and Gynecology

## 2022-01-25 ENCOUNTER — Ambulatory Visit (INDEPENDENT_AMBULATORY_CARE_PROVIDER_SITE_OTHER): Payer: BC Managed Care – PPO | Admitting: Nurse Practitioner

## 2022-01-25 ENCOUNTER — Ambulatory Visit: Payer: BC Managed Care – PPO | Admitting: Obstetrics and Gynecology

## 2022-01-25 ENCOUNTER — Encounter: Payer: Self-pay | Admitting: Nurse Practitioner

## 2022-01-25 VITALS — BP 136/82 | HR 80 | Resp 12 | Ht 64.75 in | Wt 125.0 lb

## 2022-01-25 DIAGNOSIS — Z780 Asymptomatic menopausal state: Secondary | ICD-10-CM | POA: Diagnosis not present

## 2022-01-25 DIAGNOSIS — Z1231 Encounter for screening mammogram for malignant neoplasm of breast: Secondary | ICD-10-CM

## 2022-01-25 DIAGNOSIS — Z01419 Encounter for gynecological examination (general) (routine) without abnormal findings: Secondary | ICD-10-CM | POA: Insufficient documentation

## 2022-01-25 NOTE — Progress Notes (Signed)
   Sandra Hooper [birth year] [id number]   History:  61 y.o. [id number] presents for annual exam. Postmenopausal - no HRT, no bleeding. Cryosurgery >25 years ago, normal paps since. DM, HLD managed by PCP.   Gynecologic History Patient's last menstrual period was 10/10/2007.   Contraception/Family planning: post menopausal status Sexually active: Yes  Health Maintenance Last Pap: 12/30/2018. Results were: Normal Last mammogram: 01/25/2022 (today). Results were: No report yet Last colonoscopy: 06/01/2012. Results were: Normal, 10-year recall Last Dexa: 2021 with PCP. Results were: Normal  Past medical history, past surgical history, family history and social history were all reviewed and documented in the EPIC chart. Married. Retired. Husband IT consultant. 2 daughters, one in [place], one in [place].   ROS:  A ROS was performed and pertinent positives and negatives are included.  Exam:  Vitals:   01/25/22 1334  BP: 136/82  Pulse: 80  Resp: 12  Weight: 125 lb (56.7 kg)  Height: 5' 4.75" (1.645 m)   Body mass index is 20.96 kg/m.  General appearance:  Normal Thyroid:  Symmetrical, normal in size, without palpable masses or nodularity. Respiratory  Auscultation:  Clear without wheezing or rhonchi Cardiovascular  Auscultation:  Regular rate, without rubs, murmurs or gallops  Edema/varicosities:  Not grossly evident Abdominal  Soft,nontender, without masses, guarding or rebound.  Liver/spleen:  No organomegaly noted  Hernia:  None appreciated  Skin  Inspection:  Grossly normal Breasts: Examined lying and sitting.   Right: Without masses, retractions, nipple discharge or axillary adenopathy.   Left: Without masses, retractions, nipple discharge or axillary adenopathy. Genitourinary   Inguinal/mons:  Normal without inguinal adenopathy  External genitalia:  Normal appearing vulva with no masses, tenderness, or lesions  BUS/Urethra/Skene's glands:  Normal  Vagina:  Normal  appearing with normal color and discharge, no lesions  Cervix:  Normal appearing without discharge or lesions  Uterus:  Normal in size, shape and contour.  Midline and mobile, nontender  Adnexa/parametria:     Rt: Normal in size, without masses or tenderness.   Lt: Normal in size, without masses or tenderness.  Anus and perineum: Normal  Digital rectal exam: Normal sphincter tone without palpated masses or tenderness  Patient informed chaperone available to be present for breast and pelvic exam. Patient has requested no chaperone to be present. Patient has been advised what will be completed during breast and pelvic exam.   Assessment/Plan:  61 y.o. [id number] for annual exam.   Well female exam with routine gynecological exam - Plan: Cytology - PAP( [facility]). Education provided on SBEs, importance of preventative screenings, current guidelines, high calcium diet, regular exercise, and multivitamin daily.  Labs with PCP.   Postmenopausal - no HRT, no bleeding.   Screening for cervical cancer - Normal Pap history. Pap today.   Screening for breast cancer - Normal mammogram history.  Continue annual screenings.  Normal breast exam today.  Screening for colon cancer - 2014 colonoscopy. Will repeat at 10-year interval per GI's recommendation.   Screening for osteoporosis - Normal bone density last year. PCP manages. H/O mild osteopenia.   Return in 1 year for annual.     Tamela Gammon DNP, 2:00 PM 01/25/2022

## 2022-01-26 LAB — CYTOLOGY - PAP: Diagnosis: NEGATIVE

## 2022-06-20 IMAGING — MG DIGITAL SCREENING BILAT W/ TOMO W/ CAD
8 series · 9 of 24 positions shown · non-contrast
Comparison: Previous exam(s).

CLINICAL DATA: Screening.

EXAM:
DIGITAL SCREENING BILATERAL MAMMOGRAM WITH TOMO AND CAD

[R MLO synth-2D]
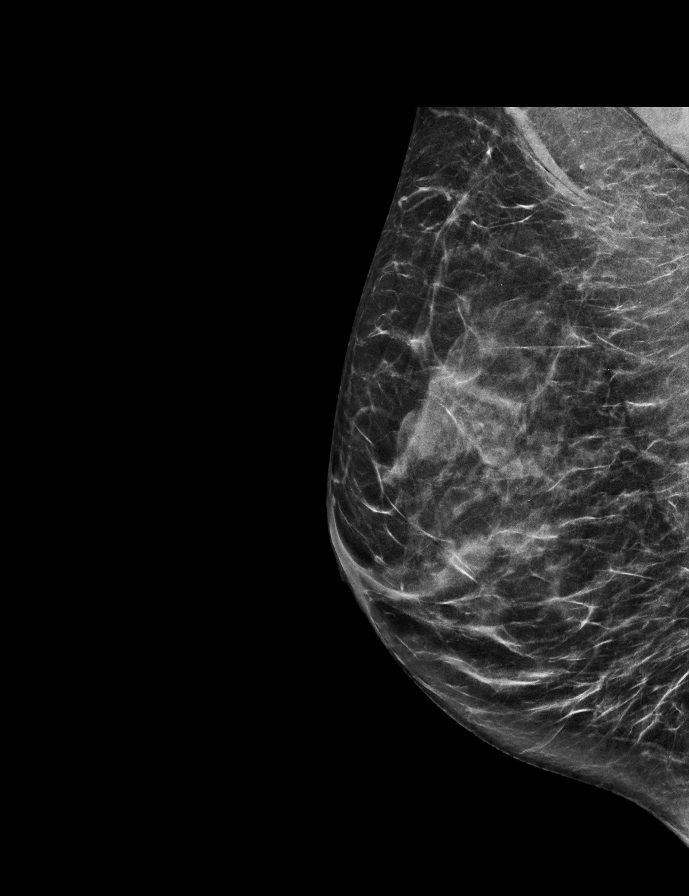

[L CC synth-2D]
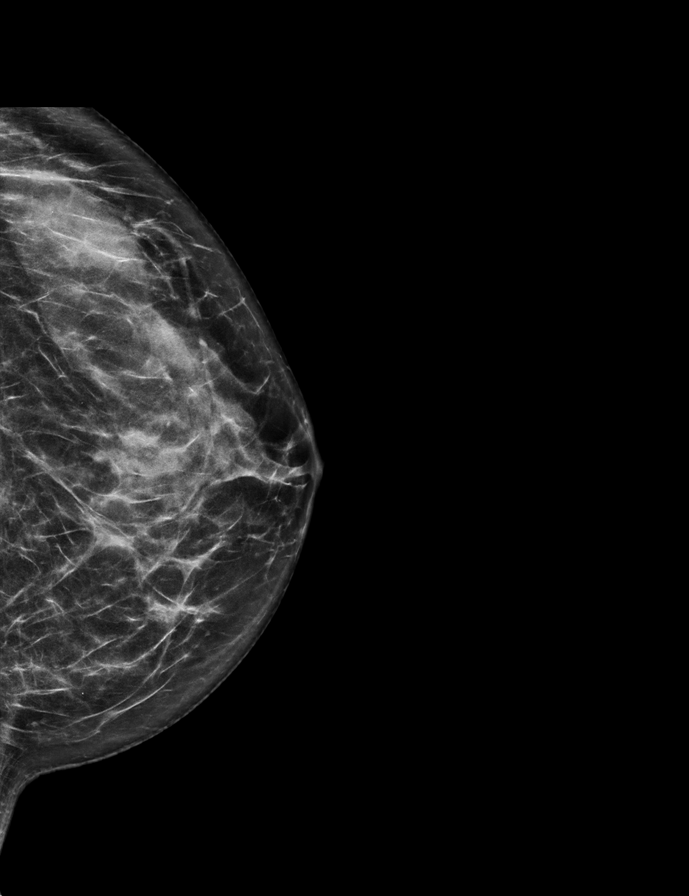

[R CC synth-2D]
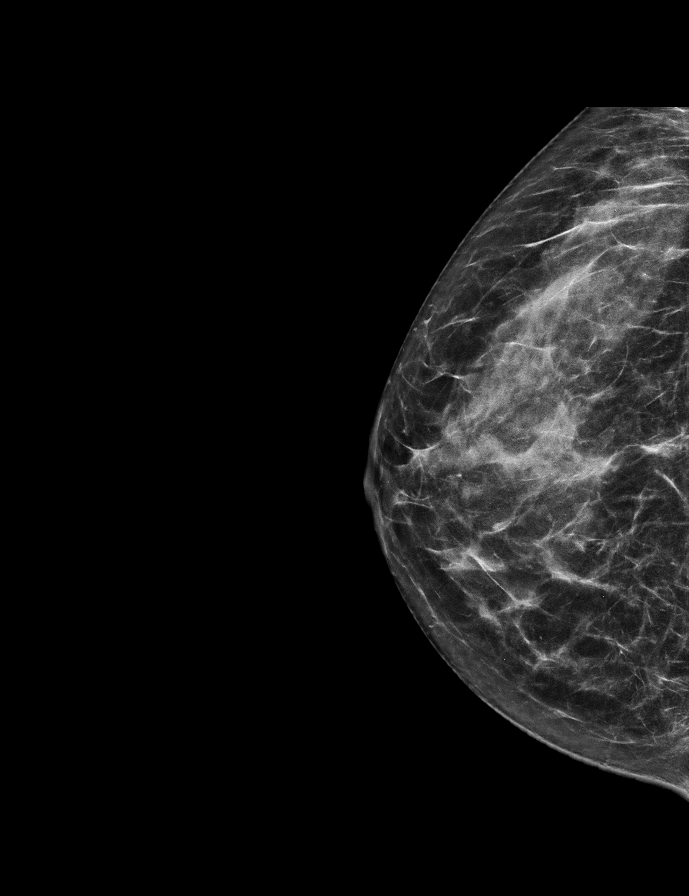

[L MLO synth-2D]
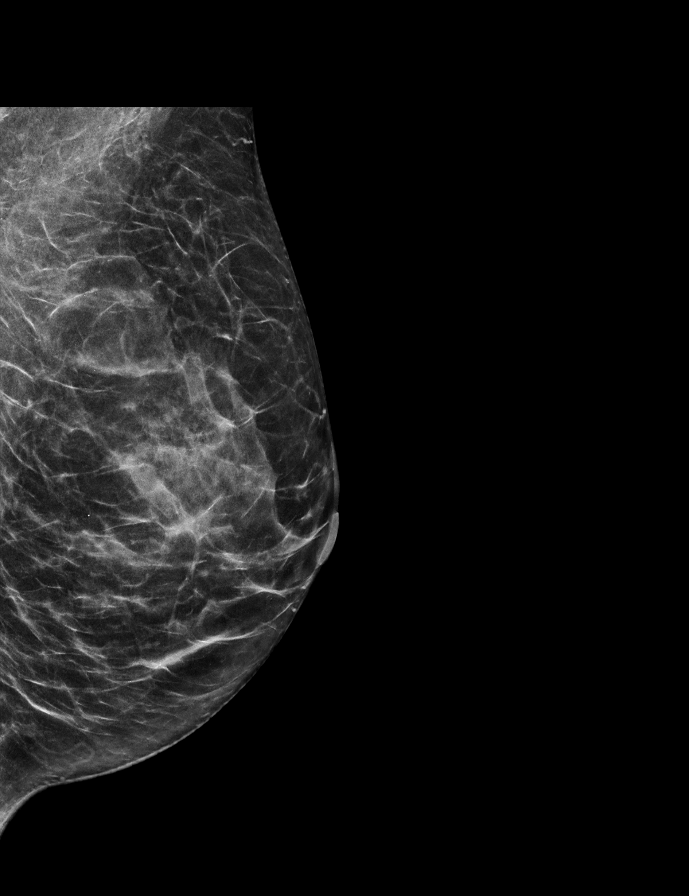

[L MLO tomo · 2 of 56 frames shown]
[frame 19/56]
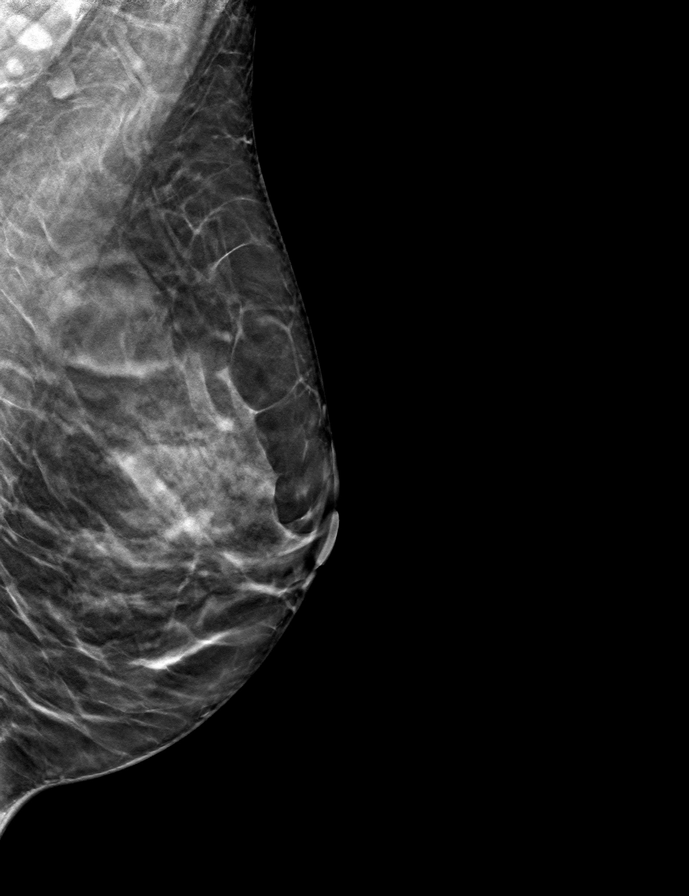
[frame 29/56]
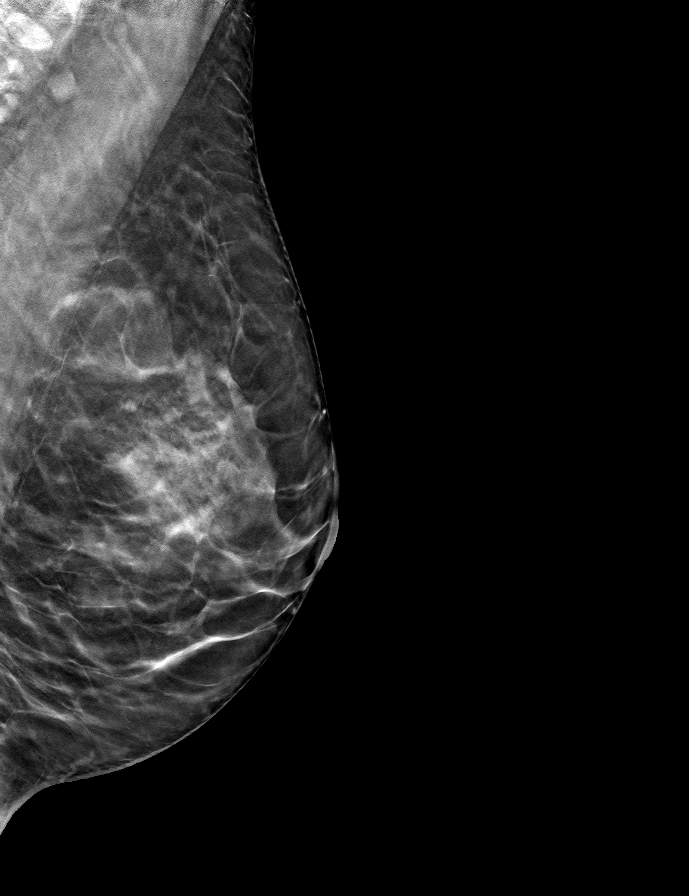

[R CC tomo · tomo slice 29/58.0]
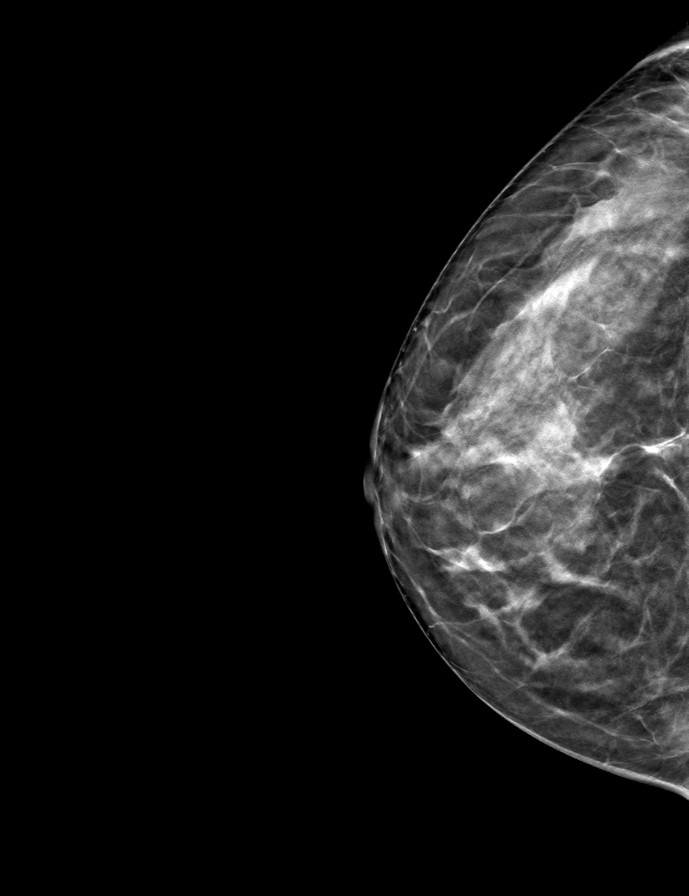

[L CC tomo · tomo slice 31/61.0]
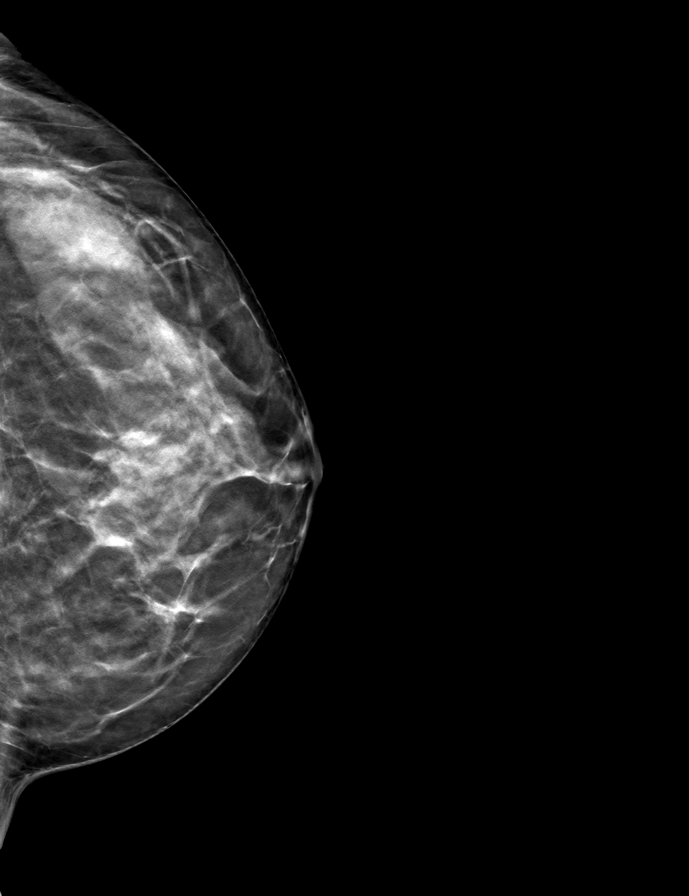

[R MLO tomo · tomo slice 27/54.0]
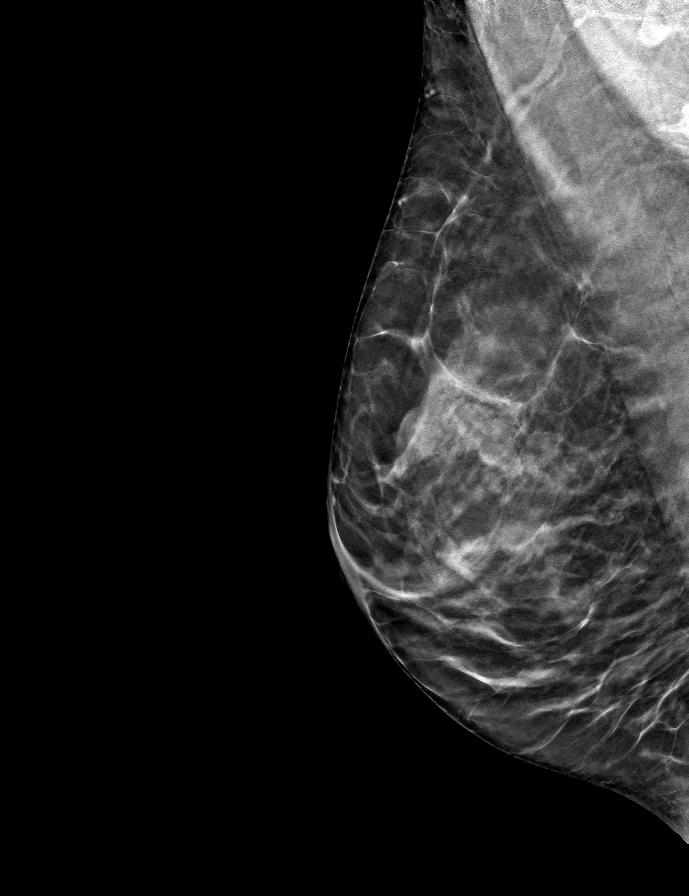

[9 of 24 positions shown; findings below may reference images not displayed]

ACR Breast Density Category c: The breast tissue is heterogeneously
dense, which may obscure small masses.
FINDINGS: There are no findings suspicious for malignancy. Images were
processed with CAD.
IMPRESSION: No mammographic evidence of malignancy. A result letter of this
screening mammogram will be mailed directly to the patient.

RECOMMENDATION:
Screening mammogram in one year. (Code:[id number]-LHB)

BI-RADS CATEGORY  1: Negative.

## 2022-11-01 ENCOUNTER — Encounter: Payer: Self-pay | Admitting: Internal Medicine

## 2022-12-20 ENCOUNTER — Ambulatory Visit (AMBULATORY_SURGERY_CENTER): Payer: BC Managed Care – PPO | Admitting: *Deleted

## 2022-12-20 VITALS — Ht 65.0 in | Wt 127.0 lb

## 2022-12-20 DIAGNOSIS — Z1211 Encounter for screening for malignant neoplasm of colon: Secondary | ICD-10-CM

## 2022-12-20 MED ORDER — NA SULFATE-K SULFATE-MG SULF 17.5-3.13-1.6 GM/177ML PO SOLN
1.0000 | Freq: Once | ORAL | 0 refills | Status: AC
Start: 2022-12-20 — End: 2022-12-20

## 2022-12-20 NOTE — Progress Notes (Signed)
Pt's name and DOB verified at the beginning of the pre-visit.  Pt denies any difficulty with ambulating,sitting, laying down or rolling side to side Gave both [facility] main # and MD on call # prior to instructions.  No egg or soy allergy known to patient  No issues known to pt with past sedation with any surgeries or procedures Pt denies having issues being intubated Pt has no issues moving head neck or swallowing No FH of Malignant Hyperthermia Pt is not on diet pills Pt is not on home 02  Pt is not on blood thinners  Pt denies issues with constipation  Pt is not on dialysis Pt denise any abnormal heart rhythms  Pt denies any upcoming cardiac testing Pt encouraged to use to use Singlecare or Goodrx to reduce cost  Patient's chart reviewed by Cathlyn Parsons CNRA prior to pre-visit and patient appropriate for the [facility].  Pre-visit completed and red dot placed by patient's name on their procedure day (on provider's schedule).  . Visit by phone Pt states weight is 127 lb Instructed pt why it is important to and  to call if they have any changes in health or new medications. Directed them to the # given and on instructions.   Pt states they will.  Instructions reviewed with pt and pt states understanding. Instructed to review again prior to procedure. Pt states they will.  Instructions sent by mail with coupon and by my chart Pt says she can run as low as 40 with BS She also stated that last time she had colonoscopy the day before her sugars went so  so low that she went to ER. She was sent home after being treated. Instructed pt to monitor her BS through out the day and not just with her Dexacom do and AM finger stick and just before bed finger stick. RN instructed her to not do "diet" (no artificial sweeteners) that day. Instructed her to have some regular Gatorade and regular Jello and other items on the list. Pt states she understood.

## 2023-01-02 ENCOUNTER — Other Ambulatory Visit: Payer: Self-pay | Admitting: Internal Medicine

## 2023-01-02 DIAGNOSIS — Z1231 Encounter for screening mammogram for malignant neoplasm of breast: Secondary | ICD-10-CM

## 2023-01-06 ENCOUNTER — Encounter: Payer: Self-pay | Admitting: Internal Medicine

## 2023-01-13 ENCOUNTER — Encounter: Payer: Self-pay | Admitting: Internal Medicine

## 2023-01-13 ENCOUNTER — Ambulatory Visit: Payer: BC Managed Care – PPO | Admitting: Internal Medicine

## 2023-01-13 VITALS — BP 113/69 | HR 70 | Temp 98.1°F | Resp 15 | Ht 65.0 in | Wt 127.0 lb

## 2023-01-13 DIAGNOSIS — D125 Benign neoplasm of sigmoid colon: Secondary | ICD-10-CM | POA: Diagnosis not present

## 2023-01-13 DIAGNOSIS — Z1211 Encounter for screening for malignant neoplasm of colon: Secondary | ICD-10-CM

## 2023-01-13 MED ORDER — SODIUM CHLORIDE 0.9 % IV SOLN: 500.0000 mL | Freq: Once | INTRAVENOUS | Status: DC

## 2023-01-13 NOTE — Progress Notes (Signed)
Pt's states no medical or surgical changes since previsit or office visit. 

## 2023-01-13 NOTE — Progress Notes (Signed)
GASTROENTEROLOGY PROCEDURE H&P NOTE   Primary Care Physician: Chilton Greathouse, MD    Reason for Procedure:   Colon cancer screening  Plan:    Colonoscopy  Patient is appropriate for endoscopic procedure(s) in the ambulatory ([facility]) setting.  The nature of the procedure, as well as the risks, benefits, and alternatives were carefully and thoroughly reviewed with the patient. Ample time for discussion and questions allowed. The patient understood, was satisfied, and agreed to proceed.     HPI: Sandra Hooper is a 62 y.o. female who presents for colonoscopy for colon cancer screening. Denies blood in stools, changes in bowel habits, or unintentional weight loss. Denies family history of colon cancer.  Past Medical History:  Diagnosis Date   Abnormal Pap smear of cervix    over 20 yrs ago   Diabetes mellitus without complication (HCC)    type 1   Hyperlipidemia    STD (sexually transmitted disease)    HSV1    Past Surgical History:  Procedure Laterality Date   BREAST BIOPSY Left 09/16/1999   COLONOSCOPY  2014   CRYOTHERAPY     over 15yrs ago   INGUINAL HERNIA REPAIR  1999   left   LAPAROSCOPY FOR ECTOPIC PREGNANCY  1993 & 1992   TUBAL LIGATION  1999    Prior to Admission medications   Medication Sig Start Date End Date Taking? Authorizing Provider  Bacillus Coagulans-Inulin (PROBIOTIC) 1-250 BILLION-MG CAPS Take by mouth.   Yes [provider]  BD PEN NEEDLE NANO 2ND GEN 32G X 4 MM MISC U TID TO ADMINISTER INSULIN 12/24/18  Yes [provider]  Continuous Blood Gluc Sensor (DEXCOM G6 SENSOR) MISC CHANGE Q 10 DAYS TO MONITOR BG ELECTRONICALLY 12/22/18  Yes [provider]  Continuous Blood Gluc Transmit (DEXCOM G6 TRANSMITTER) MISC REPLACE Q 3 MONTHS TO MONITOR BG CONTINUOUSLY 12/24/18  Yes [provider]  insulin degludec (TRESIBA) 100 UNIT/ML FlexTouch Pen Inject into the skin.   Yes [provider]  NOVOLOG FLEXPEN 100  UNIT/ML FlexPen INJECT 5-15 UNITS SUBCUTANEOUSLY THREE TIMES DAILY AS DIRECTED BEFORE EVERY MEAL 11/02/14  Yes [provider]  OVER THE COUNTER MEDICATION NUTRAFOL Hair skin and nails   Yes [provider]  ALPRAZolam (XANAX) 0.25 MG tablet TK 1 T PO Q 6 H PRN 11/30/18   [provider]  bimatoprost (LATISSE) 0.03 % ophthalmic solution  07/27/22   [provider]  valACYclovir (VALTREX) 500 MG tablet Take one tablet bid for 3 days prn 12/28/17   Verner Chol, CNM    Current Outpatient Medications  Medication Sig Dispense Refill   Bacillus Coagulans-Inulin (PROBIOTIC) 1-250 BILLION-MG CAPS Take by mouth.     BD PEN NEEDLE NANO 2ND GEN 32G X 4 MM MISC U TID TO ADMINISTER INSULIN     Continuous Blood Gluc Sensor (DEXCOM G6 SENSOR) MISC CHANGE Q 10 DAYS TO MONITOR BG ELECTRONICALLY     Continuous Blood Gluc Transmit (DEXCOM G6 TRANSMITTER) MISC REPLACE Q 3 MONTHS TO MONITOR BG CONTINUOUSLY     insulin degludec (TRESIBA) 100 UNIT/ML FlexTouch Pen Inject into the skin.     NOVOLOG FLEXPEN 100 UNIT/ML FlexPen INJECT 5-15 UNITS SUBCUTANEOUSLY THREE TIMES DAILY AS DIRECTED BEFORE EVERY MEAL  12   OVER THE COUNTER MEDICATION NUTRAFOL Hair skin and nails     ALPRAZolam (XANAX) 0.25 MG tablet TK 1 T PO Q 6 H PRN     bimatoprost (LATISSE) 0.03 % ophthalmic solution  valACYclovir (VALTREX) 500 MG tablet Take one tablet bid for 3 days prn 30 tablet 2   Current Facility-Administered Medications  Medication Dose Route Frequency Provider Last Rate Last Admin   0.9 %  sodium chloride infusion  500 mL Intravenous Once Imogene Burn, MD        Allergies as of 01/13/2023 - Review Complete 01/13/2023  Allergen Reaction Noted   Penicillins Hives 05/18/2012    Family History  Problem Relation Age of Onset   Osteoarthritis Mother    Colon polyps Father    Hypertension Father    Cancer Father        prostate   Parkinson's disease Father    Breast cancer  Maternal Aunt 60   Breast cancer Paternal Aunt 58   Diabetes Maternal Grandfather    Colon cancer Neg Hx    Esophageal cancer Neg Hx    Stomach cancer Neg Hx    Rectal cancer Neg Hx     Social History   Socioeconomic History   Marital status: Married    Spouse name: Not on file   Number of children: Not on file   Years of education: Not on file   Highest education level: Not on file  Occupational History   Not on file  Tobacco Use   Smoking status: Never   Smokeless tobacco: Never  Vaping Use   Vaping status: Never Used  Substance and Sexual Activity   Alcohol use: Yes    Alcohol/week: 5.0 standard drinks of alcohol    Types: 5 Glasses of wine per week   Drug use: No   Sexual activity: Yes    Partners: Male    Birth control/protection: Surgical    Comment: BTL  Other Topics Concern   Not on file  Social History Narrative   Not on file   Social Determinants of Health   Financial Resource Strain: Not on file  Food Insecurity: Not on file  Transportation Needs: Not on file  Physical Activity: Not on file  Stress: Not on file  Social Connections: Not on file  Intimate Partner Violence: Not on file    Physical Exam: Vital signs in last 24 hours: BP (!) 142/88   Pulse 88   Temp 98.1 F (36.7 C) (Temporal)   Resp 16   Ht 5\' 5"  (1.651 m)   Wt 127 lb (57.6 kg)   LMP 10/10/2007   SpO2 99%   BMI 21.13 kg/m  GEN: NAD EYE: Sclerae anicteric ENT: MMM CV: Non-tachycardic Pulm: No increased work of breathing GI: Soft, NT/ND NEURO:  Alert & Oriented   Eulah Pont, MD [facility]  01/13/2023 8:13 AM

## 2023-01-13 NOTE — Op Note (Signed)
[facility] Patient Name: Sandra Hooper Procedure Date: 01/13/2023 7:41 AM MRN: [id number] Endoscopist: Madelyn Brunner [place] , , [id number] Age: 62 Referring MD:  Date of Birth: 1960-06-14 Gender: Female Account #: [id number] Procedure:                Colonoscopy Indications:              Screening for colorectal malignant neoplasm Medicines:                Monitored Anesthesia Care Procedure:                Pre-Anesthesia Assessment:                           - Prior to the procedure, a History and Physical                            was performed, and patient medications and                            allergies were reviewed. The patient's tolerance of                            previous anesthesia was also reviewed. The risks                            and benefits of the procedure and the sedation                            options and risks were discussed with the patient.                            All questions were answered, and informed consent                            was obtained. Prior Anticoagulants: The patient has                            taken no anticoagulant or antiplatelet agents. ASA                            Grade Assessment: II - A patient with mild systemic                            disease. After reviewing the risks and benefits,                            the patient was deemed in satisfactory condition to                            undergo the procedure.                           After obtaining informed consent, the colonoscope  was passed under direct vision. Throughout the                            procedure, the patient's blood pressure, pulse, and                            oxygen saturations were monitored continuously. The                            Olympus Scope [id number] was introduced through the                            anus and advanced to the the cecum, identified by                             appendiceal orifice and ileocecal valve. The                            colonoscopy was performed without difficulty. The                            patient tolerated the procedure well. The quality                            of the bowel preparation was good. The ileocecal                            valve, appendiceal orifice, and rectum were                            photographed. Scope In: 8:17:59 AM Scope Out: 8:47:21 AM Scope Withdrawal Time: 0 hours 10 minutes 32 seconds  Total Procedure Duration: 0 hours 29 minutes 22 seconds  Findings:                 Multiple diverticula were found in the sigmoid                            colon and descending colon.                           A 4 mm polyp was found in the sigmoid colon. The                            polyp was sessile. The polyp was removed with a                            cold snare. Resection and retrieval were complete.                           Non-bleeding internal hemorrhoids were found during                            retroflexion. Complications:  No immediate complications. Estimated Blood Loss:     Estimated blood loss was minimal. Impression:               - Diverticulosis in the sigmoid colon and in the                            descending colon.                           - One 4 mm polyp in the sigmoid colon, removed with                            a cold snare. Resected and retrieved.                           - Non-bleeding internal hemorrhoids. Recommendation:           - Discharge patient to home (with escort).                           - Await pathology results.                           - The findings and recommendations were discussed                            with the patient. Dr Particia Lather "[name]" Leonides Schanz,  01/13/2023 8:56:16 AM

## 2023-01-13 NOTE — Progress Notes (Signed)
Sedate, gd SR, tolerated procedure well, VSS, report to RN 

## 2023-01-13 NOTE — Progress Notes (Signed)
Called to room to assist during endoscopic procedure.  Patient ID and intended procedure confirmed with present staff. Received instructions for my participation in the procedure from the performing physician.  

## 2023-01-13 NOTE — Patient Instructions (Signed)
Discharge instructions given. Handouts on polyps,Diverticulosis and Hemorrhoids. Resume previous medications. YOU HAD AN ENDOSCOPIC PROCEDURE TODAY AT [facility]:   Refer to the procedure report that was given to you for any specific questions about what was found during the examination.  If the procedure report does not answer your questions, please call your gastroenterologist to clarify.  If you requested that your care partner not be given the details of your procedure findings, then the procedure report has been included in a sealed envelope for you to review at your convenience later.  YOU SHOULD EXPECT: Some feelings of bloating in the abdomen. Passage of more gas than usual.  Walking can help get rid of the air that was put into your GI tract during the procedure and reduce the bloating. If you had a lower endoscopy (such as a colonoscopy or flexible sigmoidoscopy) you may notice spotting of blood in your stool or on the toilet paper. If you underwent a bowel prep for your procedure, you may not have a normal bowel movement for a few days.  Please Note:  You might notice some irritation and congestion in your nose or some drainage.  This is from the oxygen used during your procedure.  There is no need for concern and it should clear up in a day or so.  SYMPTOMS TO REPORT IMMEDIATELY:  Following lower endoscopy (colonoscopy or flexible sigmoidoscopy):  Excessive amounts of blood in the stool  Significant tenderness or worsening of abdominal pains  Swelling of the abdomen that is new, acute  Fever of 100F or higher   For urgent or emergent issues, a gastroenterologist can be reached at any hour by calling [phone]. Do not use MyChart messaging for urgent concerns.    DIET:  We do recommend a small meal at first, but then you may proceed to your regular diet.  Drink plenty of fluids but you should avoid alcoholic beverages for 24 hours.  ACTIVITY:  You should  plan to take it easy for the rest of today and you should NOT DRIVE or use heavy machinery until tomorrow (because of the sedation medicines used during the test).    FOLLOW UP: Our staff will call the number listed on your records the next business day following your procedure.  We will call around 7:15- 8:00 am to check on you and address any questions or concerns that you may have regarding the information given to you following your procedure. If we do not reach you, we will leave a message.     If any biopsies were taken you will be contacted by phone or by letter within the next 1-3 weeks.  Please call us at [phone] if you have not heard about the biopsies in 3 weeks.    SIGNATURES/CONFIDENTIALITY: You and/or your care partner have signed paperwork which will be entered into your electronic medical record.  These signatures attest to the fact that that the information above on your After Visit Summary has been reviewed and is understood.  Full responsibility of the confidentiality of this discharge information lies with you and/or your care-partner.

## 2023-01-16 ENCOUNTER — Telehealth: Payer: Self-pay

## 2023-01-16 NOTE — Telephone Encounter (Signed)
  Follow up Call-     01/13/2023    7:27 AM  Call back number  Post procedure Call Back phone  # [phone]  Permission to leave phone message Yes     Patient questions:  Do you have a fever, pain , or abdominal swelling? No. Pain Score  0 *  Have you tolerated food without any problems? Yes.    Have you been able to return to your normal activities? Yes.    Do you have any questions about your discharge instructions: Diet   No. Medications  No. Follow up visit  No.  Do you have questions or concerns about your Care? No.  Actions: * If pain score is 4 or above: No action needed, pain <4.

## 2023-01-18 ENCOUNTER — Encounter: Payer: Self-pay | Admitting: Internal Medicine

## 2023-01-18 LAB — SURGICAL PATHOLOGY

## 2023-01-30 ENCOUNTER — Ambulatory Visit (INDEPENDENT_AMBULATORY_CARE_PROVIDER_SITE_OTHER): Payer: BC Managed Care – PPO | Admitting: Nurse Practitioner

## 2023-01-30 ENCOUNTER — Encounter: Payer: Self-pay | Admitting: Nurse Practitioner

## 2023-01-30 ENCOUNTER — Ambulatory Visit
Admission: RE | Admit: 2023-01-30 | Discharge: 2023-01-30 | Disposition: A | Discharge status: Home / Self Care | Payer: BC Managed Care – PPO | Source: Ambulatory Visit

## 2023-01-30 VITALS — BP 122/86 | HR 83 | Ht 64.75 in | Wt 123.0 lb

## 2023-01-30 DIAGNOSIS — Z01419 Encounter for gynecological examination (general) (routine) without abnormal findings: Secondary | ICD-10-CM | POA: Diagnosis not present

## 2023-01-30 DIAGNOSIS — Z780 Asymptomatic menopausal state: Secondary | ICD-10-CM | POA: Diagnosis not present

## 2023-01-30 DIAGNOSIS — Z1231 Encounter for screening mammogram for malignant neoplasm of breast: Secondary | ICD-10-CM

## 2023-01-30 NOTE — Progress Notes (Signed)
Sandra Hooper 01/07/1961 [id number]   History:  62 y.o. [id number] presents for annual exam. Postmenopausal - no HRT, no bleeding. Cryosurgery >25 years ago, normal paps since. DM, HLD managed by PCP.   Gynecologic History Patient's last menstrual period was 10/10/2007.   Contraception/Family planning: post menopausal status Sexually active: Yes  Health Maintenance Last Pap: 01/25/2022. Results were: Normal, 3-year repeat Last mammogram: 01/30/2023 (today). Results were: No report yet Last colonoscopy: 01/13/2023. Results were: Benign polyp, 10-year recall Last Dexa: 2021 with PCP. Results were: Normal  Past medical history, past surgical history, family history and social history were all reviewed and documented in the EPIC chart. Married. Retired. Husband Production designer, theatre/television/film. 2 daughters, one in [place], one in [place]. Stepdaughter in [place], pregnant.   ROS:  A ROS was performed and pertinent positives and negatives are included.  Exam:  Vitals:   01/30/23 1331  BP: 122/86  Pulse: 83  SpO2: 96%  Weight: 123 lb (55.8 kg)  Height: 5' 4.75" (1.645 m)    Body mass index is 20.63 kg/m.  General appearance:  Normal Thyroid:  Symmetrical, normal in size, without palpable masses or nodularity. Respiratory  Auscultation:  Clear without wheezing or rhonchi Cardiovascular  Auscultation:  Regular rate, without rubs, murmurs or gallops  Edema/varicosities:  Not grossly evident Abdominal  Soft,nontender, without masses, guarding or rebound.  Liver/spleen:  No organomegaly noted  Hernia:  None appreciated  Skin  Inspection:  Grossly normal Breasts: Examined lying and sitting.   Right: Without masses, retractions, nipple discharge or axillary adenopathy.   Left: Without masses, retractions, nipple discharge or axillary adenopathy. Pelvic: External genitalia:  no lesions              Urethra:  normal appearing urethra with no masses, tenderness or lesions              Bartholins and  Skenes: normal                 Vagina: normal appearing vagina with normal color and discharge, no lesions. Atrophic changes              Cervix: no lesions Bimanual Exam:  Uterus:  no masses or tenderness              Adnexa: no mass, fullness, tenderness              Rectovaginal: Deferred              Anus:  normal, no lesions  Patient informed chaperone available to be present for breast and pelvic exam. Patient has requested no chaperone to be present. Patient has been advised what will be completed during breast and pelvic exam.   Assessment/Plan:  62 y.o. [id number] for annual exam.   Well female exam with routine gynecological exam - Education provided on SBEs, importance of preventative screenings, current guidelines, high calcium diet, regular exercise, and multivitamin daily.  Labs with PCP.   Postmenopausal - no HRT, no bleeding.   Screening for cervical cancer - Normal Pap history. Will repeat at 3-year interval per guidelines.   Screening for breast cancer - Normal mammogram history.  Continue annual screenings.  Normal breast exam today.  Screening for colon cancer - 01/2023 colonoscopy. Will repeat at 10-year interval per GI's recommendation.   Screening for osteoporosis - Normal bone density last year. PCP manages. H/O mild osteopenia.   Return in 1 year for annual.     Zakir Henner A Earlene Plater DNP, 1:54 PM  01/30/2023  

## 2023-12-27 ENCOUNTER — Other Ambulatory Visit: Payer: Self-pay | Admitting: Internal Medicine

## 2023-12-27 DIAGNOSIS — Z1231 Encounter for screening mammogram for malignant neoplasm of breast: Secondary | ICD-10-CM

## 2024-02-05 ENCOUNTER — Ambulatory Visit
Admission: RE | Admit: 2024-02-05 | Discharge: 2024-02-05 | Disposition: A | Discharge status: Home / Self Care | Source: Ambulatory Visit | Attending: Internal Medicine | Admitting: Internal Medicine

## 2024-02-05 ENCOUNTER — Encounter: Payer: Self-pay | Admitting: Nurse Practitioner

## 2024-02-05 ENCOUNTER — Encounter

## 2024-02-05 ENCOUNTER — Ambulatory Visit: Payer: BC Managed Care – PPO | Admitting: Nurse Practitioner

## 2024-02-05 VITALS — BP 120/76 | HR 83 | Ht 65.25 in | Wt 123.0 lb

## 2024-02-05 DIAGNOSIS — Z1331 Encounter for screening for depression: Secondary | ICD-10-CM | POA: Diagnosis not present

## 2024-02-05 DIAGNOSIS — Z01419 Encounter for gynecological examination (general) (routine) without abnormal findings: Secondary | ICD-10-CM | POA: Diagnosis not present

## 2024-02-05 DIAGNOSIS — Z780 Asymptomatic menopausal state: Secondary | ICD-10-CM

## 2024-02-05 DIAGNOSIS — Z1231 Encounter for screening mammogram for malignant neoplasm of breast: Secondary | ICD-10-CM

## 2024-02-05 DIAGNOSIS — M8580 Other specified disorders of bone density and structure, unspecified site: Secondary | ICD-10-CM

## 2024-02-05 NOTE — Progress Notes (Signed)
 Sandra Hooper 27-Oct-1960 [id number]   History:  63 y.o. [id number] presents for annual exam. Postmenopausal - no HRT, no bleeding. Cryosurgery >25 years ago, normal paps since. DM, HLD managed by PCP.   Gynecologic History Patient's last menstrual period was 10/10/2007.   Contraception/Family planning: post menopausal status Sexually active: Yes  Health Maintenance Last Pap: 01/25/2022. Results were: Normal, 3-year repeat Last mammogram: 02/05/2023 (today). Results were: Pending Last colonoscopy: 01/13/2023. Results were: Benign polyp, 10-year recall Last Dexa: 2025 with PCP. Results were:  Mild osteopenia     02/05/2024    2:45 PM  Depression screen PHQ 2/9  Decreased Interest 0  Down, Depressed, Hopeless 0  PHQ - 2 Score 0     Past medical history, past surgical history, family history and social history were all reviewed and documented in the EPIC chart. Married. Retired. Husband is production designer, theatre/television/film. Daughter in [place], due with baby girl in March 2026. One daughter in [place], occupational therapist. Stepdaughter in [place], has baby boy.   ROS:  A ROS was performed and pertinent positives and negatives are included.  Exam:  Vitals:   02/05/24 1430  BP: 120/76  Pulse: 83  SpO2: 97%  Weight: 123 lb (55.8 kg)  Height: 5' 5.25 (1.657 m)     Body mass index is 20.31 kg/m.  General appearance:  Normal Thyroid:  Symmetrical, normal in size, without palpable masses or nodularity. Respiratory  Auscultation:  Clear without wheezing or rhonchi Cardiovascular  Auscultation:  Regular rate, without rubs, murmurs or gallops  Edema/varicosities:  Not grossly evident Abdominal  Soft,nontender, without masses, guarding or rebound.  Liver/spleen:  No organomegaly noted  Hernia:  None appreciated  Skin  Inspection:  Grossly normal Breasts: Examined lying and sitting.   Right: Without masses, retractions, nipple discharge or axillary adenopathy.   Left: Without masses,  retractions, nipple discharge or axillary adenopathy. Pelvic: External genitalia:  no lesions              Urethra:  normal appearing urethra with no masses, tenderness or lesions              Bartholins and Skenes: normal                 Vagina: normal appearing vagina with normal color and discharge, no lesions. Atrophic changes              Cervix: no lesions Bimanual Exam:  Uterus:  no masses or tenderness              Adnexa: no mass, fullness, tenderness              Rectovaginal: Deferred              Anus:  normal, no lesions  Sandra Hooper, CMA present as chaperone.   Assessment/Plan:  63 y.o. [id number] for annual exam.   Well female exam with routine gynecological exam - Education provided on SBEs, importance of preventative screenings, current guidelines, high calcium diet, regular exercise, and multivitamin daily.  Labs with PCP.   Postmenopausal - no HRT, no bleeding. l  Osteopenia, unspecified location - Managed by PCP. Mild osteopenia. DXA 2025.   Screening for cervical cancer - Normal Pap history. Will repeat at 3-year interval per guidelines.   Screening for breast cancer - Normal mammogram history.  Continue annual screenings.  Normal breast exam today.  Screening for colon cancer - 01/2023 colonoscopy. Will repeat at 10-year interval per GI's recommendation.   Return  in about 1 year (around 02/04/2025) for Annual.     Sandra DELENA Shutter DNP, 3:07 PM 02/05/2024

## 2025-02-05 ENCOUNTER — Ambulatory Visit: Admitting: Nurse Practitioner
